# Patient Record
Sex: Female | Born: 1990 | Race: Black or African American | Hispanic: No | Marital: Single | Smoking: Current every day smoker
Health system: Southern US, Community
[De-identification: ages and names within clinical notes are randomized; demographics above are authoritative.]

## PROBLEM LIST (undated history)

## (undated) ENCOUNTER — Ambulatory Visit (HOSPITAL_COMMUNITY): Payer: Medicaid Other

## (undated) ENCOUNTER — Inpatient Hospital Stay (HOSPITAL_COMMUNITY): Payer: Self-pay

## (undated) DIAGNOSIS — R87629 Unspecified abnormal cytological findings in specimens from vagina: Secondary | ICD-10-CM

## (undated) DIAGNOSIS — J40 Bronchitis, not specified as acute or chronic: Secondary | ICD-10-CM

## (undated) DIAGNOSIS — O99310 Alcohol use complicating pregnancy, unspecified trimester: Secondary | ICD-10-CM

## (undated) DIAGNOSIS — A549 Gonococcal infection, unspecified: Secondary | ICD-10-CM

## (undated) HISTORY — PX: OTHER SURGICAL HISTORY: SHX169

## (undated) HISTORY — DX: Alcohol use complicating pregnancy, unspecified trimester: O99.310

## (undated) HISTORY — PX: WISDOM TOOTH EXTRACTION: SHX21

---
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
## 2008-09-06 ENCOUNTER — Emergency Department (HOSPITAL_COMMUNITY): Admission: EM | Admit: 2008-09-06 | Discharge: 2008-09-06 | Payer: Self-pay | Admitting: Emergency Medicine

## 2008-11-08 ENCOUNTER — Emergency Department (HOSPITAL_COMMUNITY): Admission: EM | Admit: 2008-11-08 | Discharge: 2008-11-08 | Payer: Self-pay | Admitting: Emergency Medicine

## 2009-05-06 ENCOUNTER — Emergency Department (HOSPITAL_COMMUNITY): Admission: EM | Admit: 2009-05-06 | Discharge: 2009-05-06 | Payer: Self-pay | Admitting: Emergency Medicine

## 2009-11-08 ENCOUNTER — Emergency Department (HOSPITAL_COMMUNITY): Admission: EM | Admit: 2009-11-08 | Discharge: 2009-11-08 | Payer: Self-pay | Admitting: Emergency Medicine

## 2010-01-30 ENCOUNTER — Emergency Department (HOSPITAL_COMMUNITY): Admission: EM | Admit: 2010-01-30 | Discharge: 2010-01-30 | Payer: Self-pay | Admitting: Emergency Medicine

## 2010-03-07 ENCOUNTER — Observation Stay (HOSPITAL_COMMUNITY)
Admission: EM | Admit: 2010-03-07 | Discharge: 2010-03-07 | Payer: Self-pay | Source: Home / Self Care | Admitting: Emergency Medicine

## 2010-03-07 ENCOUNTER — Emergency Department (HOSPITAL_COMMUNITY)
Admission: EM | Admit: 2010-03-07 | Discharge: 2010-03-07 | Disposition: A | Discharge status: Other Acute Inpatient Hospital | Payer: Self-pay | Source: Home / Self Care | Admitting: Emergency Medicine

## 2010-05-22 LAB — URINE CULTURE

## 2010-05-22 LAB — URINE MICROSCOPIC-ADD ON

## 2010-05-22 LAB — POCT I-STAT, CHEM 8
Chloride: 98 mEq/L (ref 96–112)
HCT: 51 % — ABNORMAL HIGH (ref 36.0–46.0)
Hemoglobin: 17.3 g/dL — ABNORMAL HIGH (ref 12.0–15.0)
Potassium: 3.6 mEq/L (ref 3.5–5.1)
Sodium: 135 mEq/L (ref 135–145)

## 2010-05-22 LAB — DIFFERENTIAL
Basophils Relative: 0 % (ref 0–1)
Eosinophils Absolute: 0 10*3/uL (ref 0.0–0.7)
Monocytes Relative: 11 % (ref 3–12)
Neutrophils Relative %: 85 % — ABNORMAL HIGH (ref 43–77)

## 2010-05-22 LAB — POCT URINALYSIS DIPSTICK
Glucose, UA: 100 mg/dL — AB
Ketones, ur: 15 mg/dL — AB
Specific Gravity, Urine: >=1.03 (ref 1.005–1.030)

## 2010-05-22 LAB — POCT PREGNANCY, URINE
Preg Test, Ur: NEGATIVE
Preg Test, Ur: NEGATIVE

## 2010-05-22 LAB — CBC
HCT: 36.2 % (ref 36.0–46.0)
Hemoglobin: 12.6 g/dL (ref 12.0–15.0)
RBC: 3.92 MIL/uL (ref 3.87–5.11)
WBC: 18.5 10*3/uL — ABNORMAL HIGH (ref 4.0–10.5)

## 2010-05-22 LAB — URINALYSIS, ROUTINE W REFLEX MICROSCOPIC
Glucose, UA: NEGATIVE mg/dL
Ketones, ur: 15 mg/dL — AB
Specific Gravity, Urine: 1.017 (ref 1.005–1.030)
pH: 5.5 (ref 5.0–8.0)

## 2010-05-22 LAB — COMPREHENSIVE METABOLIC PANEL
ALT: 43 U/L — ABNORMAL HIGH (ref 0–35)
Alkaline Phosphatase: 44 U/L (ref 39–117)
CO2: 22 mEq/L (ref 19–32)
GFR calc non Af Amer: 33 mL/min — ABNORMAL LOW (ref >60)
Glucose, Bld: 115 mg/dL — ABNORMAL HIGH (ref 70–99)
Potassium: 3.1 mEq/L — ABNORMAL LOW (ref 3.5–5.1)
Sodium: 130 mEq/L — ABNORMAL LOW (ref 135–145)

## 2010-05-22 LAB — LIPASE, BLOOD: Lipase: 15 U/L (ref 11–59)

## 2010-05-23 LAB — POCT URINALYSIS DIPSTICK
Bilirubin Urine: NEGATIVE
Glucose, UA: NEGATIVE mg/dL
Ketones, ur: NEGATIVE mg/dL
Specific Gravity, Urine: 1.02 (ref 1.005–1.030)

## 2010-05-23 LAB — POCT PREGNANCY, URINE: Preg Test, Ur: NEGATIVE

## 2010-05-31 LAB — URINALYSIS, ROUTINE W REFLEX MICROSCOPIC
Bilirubin Urine: NEGATIVE
Hgb urine dipstick: NEGATIVE
Specific Gravity, Urine: 1.006 (ref 1.005–1.030)
pH: 7.5 (ref 5.0–8.0)

## 2010-05-31 LAB — CBC
HCT: 41 % (ref 36.0–46.0)
Hemoglobin: 13.5 g/dL (ref 12.0–15.0)
MCV: 95.8 fL (ref 78.0–100.0)
RBC: 4.28 MIL/uL (ref 3.87–5.11)
WBC: 6.8 10*3/uL (ref 4.0–10.5)

## 2010-05-31 LAB — COMPREHENSIVE METABOLIC PANEL
AST: 22 U/L (ref 0–37)
BUN: 8 mg/dL (ref 6–23)
CO2: 29 mEq/L (ref 19–32)
Chloride: 107 mEq/L (ref 96–112)
Creatinine, Ser: 0.81 mg/dL (ref 0.4–1.2)
GFR calc non Af Amer: >60 mL/min (ref >60)
Total Bilirubin: 0.3 mg/dL (ref 0.3–1.2)

## 2010-05-31 LAB — DIFFERENTIAL
Basophils Absolute: 0 10*3/uL (ref 0.0–0.1)
Lymphocytes Relative: 33 % (ref 12–46)
Lymphs Abs: 2.2 10*3/uL (ref 0.7–4.0)
Monocytes Relative: 6 % (ref 3–12)

## 2010-06-17 LAB — POCT PREGNANCY, URINE: Preg Test, Ur: NEGATIVE

## 2010-06-30 ENCOUNTER — Inpatient Hospital Stay (INDEPENDENT_AMBULATORY_CARE_PROVIDER_SITE_OTHER)
Admission: RE | Admit: 2010-06-30 | Discharge: 2010-06-30 | Disposition: A | Discharge status: Home / Self Care | Payer: Self-pay | Source: Ambulatory Visit | Attending: Family Medicine | Admitting: Family Medicine

## 2010-06-30 DIAGNOSIS — N760 Acute vaginitis: Secondary | ICD-10-CM

## 2010-06-30 LAB — POCT URINALYSIS DIP (DEVICE)
Glucose, UA: NEGATIVE mg/dL
Nitrite: NEGATIVE
Protein, ur: NEGATIVE mg/dL
Urobilinogen, UA: 0.2 mg/dL (ref 0.0–1.0)
pH: 6.5 (ref 5.0–8.0)

## 2010-06-30 LAB — WET PREP, GENITAL: Trich, Wet Prep: NONE SEEN

## 2010-07-01 LAB — GC/CHLAMYDIA PROBE AMP, GENITAL
Chlamydia, DNA Probe: NEGATIVE
GC Probe Amp, Genital: NEGATIVE

## 2010-09-17 ENCOUNTER — Inpatient Hospital Stay (INDEPENDENT_AMBULATORY_CARE_PROVIDER_SITE_OTHER)
Admission: RE | Admit: 2010-09-17 | Discharge: 2010-09-17 | Disposition: A | Discharge status: Home / Self Care | Payer: Self-pay | Source: Ambulatory Visit | Attending: Emergency Medicine | Admitting: Emergency Medicine

## 2010-09-17 DIAGNOSIS — N949 Unspecified condition associated with female genital organs and menstrual cycle: Secondary | ICD-10-CM

## 2010-09-17 DIAGNOSIS — N898 Other specified noninflammatory disorders of vagina: Secondary | ICD-10-CM

## 2010-09-17 LAB — POCT PREGNANCY, URINE: Preg Test, Ur: NEGATIVE

## 2010-09-17 LAB — WET PREP, GENITAL
Trich, Wet Prep: NONE SEEN
Yeast Wet Prep HPF POC: NONE SEEN

## 2010-09-17 LAB — POCT URINALYSIS DIP (DEVICE)
Bilirubin Urine: NEGATIVE
Glucose, UA: NEGATIVE mg/dL
Ketones, ur: NEGATIVE mg/dL
Specific Gravity, Urine: 1.01 (ref 1.005–1.030)

## 2010-09-22 ENCOUNTER — Inpatient Hospital Stay (HOSPITAL_COMMUNITY): Payer: Self-pay

## 2010-09-22 ENCOUNTER — Encounter (HOSPITAL_COMMUNITY): Payer: Self-pay | Admitting: *Deleted

## 2010-09-22 ENCOUNTER — Inpatient Hospital Stay (HOSPITAL_COMMUNITY)
Admission: AD | Admit: 2010-09-22 | Discharge: 2010-09-22 | Disposition: A | Discharge status: Home / Self Care | Payer: Self-pay | Source: Ambulatory Visit | Attending: Obstetrics and Gynecology | Admitting: Obstetrics and Gynecology

## 2010-09-22 DIAGNOSIS — R109 Unspecified abdominal pain: Secondary | ICD-10-CM

## 2010-09-22 DIAGNOSIS — B9689 Other specified bacterial agents as the cause of diseases classified elsewhere: Secondary | ICD-10-CM | POA: Insufficient documentation

## 2010-09-22 DIAGNOSIS — N760 Acute vaginitis: Secondary | ICD-10-CM | POA: Insufficient documentation

## 2010-09-22 DIAGNOSIS — A499 Bacterial infection, unspecified: Secondary | ICD-10-CM

## 2010-09-22 DIAGNOSIS — N72 Inflammatory disease of cervix uteri: Secondary | ICD-10-CM

## 2010-09-22 LAB — URINALYSIS, ROUTINE W REFLEX MICROSCOPIC
Glucose, UA: NEGATIVE mg/dL
Hgb urine dipstick: NEGATIVE
Ketones, ur: NEGATIVE mg/dL
Protein, ur: NEGATIVE mg/dL
pH: 5.5 (ref 5.0–8.0)

## 2010-09-22 LAB — WET PREP, GENITAL: Trich, Wet Prep: NONE SEEN

## 2010-09-22 LAB — CBC
Hemoglobin: 12.8 g/dL (ref 12.0–15.0)
MCHC: 33.7 g/dL (ref 30.0–36.0)
Platelets: 267 10*3/uL (ref 150–400)
RBC: 3.98 MIL/uL (ref 3.87–5.11)

## 2010-09-22 LAB — HCG, SERUM, QUALITATIVE: Preg, Serum: NEGATIVE

## 2010-09-22 MED ORDER — KETOROLAC TROMETHAMINE 60 MG/2ML IM SOLN
60.0000 mg | Freq: Once | INTRAMUSCULAR | Status: DC
  Filled 2010-09-22: qty 2

## 2010-09-22 MED ORDER — AZITHROMYCIN 1 G PO PACK
1.0000 g | PACK | Freq: Once | ORAL | Status: AC
  Administered 2010-09-22: 1 g via ORAL
  Filled 2010-09-22: qty 1

## 2010-09-22 MED ORDER — CEFIXIME 400 MG PO TABS
400.0000 mg | ORAL_TABLET | Freq: Once | ORAL | Status: AC
  Administered 2010-09-22: 400 mg via ORAL
  Filled 2010-09-22: qty 1

## 2010-09-22 NOTE — Progress Notes (Signed)
[facility] and got rx for flagyl; pt just got rx filled today; had some bleeding on Monday but is not bleeding; last intercourse last month; pt missed this month;

## 2010-09-22 NOTE — Progress Notes (Signed)
Was given lortabs at hosp for pain in rt side ( bleeding ovary). Took pain med on Sun, had started bleeding on Sat, initially was heavy- but it stopped that night.  Now feels funny.
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
## 2010-09-27 ENCOUNTER — Emergency Department (HOSPITAL_COMMUNITY)
Admission: EM | Admit: 2010-09-27 | Discharge: 2010-09-27 | Disposition: A | Discharge status: Home / Self Care | Payer: Self-pay | Attending: Emergency Medicine | Admitting: Emergency Medicine

## 2010-09-27 ENCOUNTER — Emergency Department (HOSPITAL_COMMUNITY): Payer: Self-pay

## 2010-09-27 DIAGNOSIS — W269XXA Contact with unspecified sharp object(s), initial encounter: Secondary | ICD-10-CM | POA: Insufficient documentation

## 2010-09-27 DIAGNOSIS — S8990XA Unspecified injury of unspecified lower leg, initial encounter: Secondary | ICD-10-CM | POA: Insufficient documentation

## 2010-09-27 DIAGNOSIS — S91109A Unspecified open wound of unspecified toe(s) without damage to nail, initial encounter: Secondary | ICD-10-CM | POA: Insufficient documentation

## 2010-09-27 DIAGNOSIS — S99919A Unspecified injury of unspecified ankle, initial encounter: Secondary | ICD-10-CM | POA: Insufficient documentation

## 2010-09-27 DIAGNOSIS — M79609 Pain in unspecified limb: Secondary | ICD-10-CM | POA: Insufficient documentation

## 2010-09-27 DIAGNOSIS — F1010 Alcohol abuse, uncomplicated: Secondary | ICD-10-CM | POA: Insufficient documentation

## 2010-11-30 LAB — CBC
HCT: 35.7 — ABNORMAL LOW
Hemoglobin: 11.9 — ABNORMAL LOW
Platelets: 348
RDW: 15.4
WBC: 6.8

## 2010-11-30 LAB — DIFFERENTIAL
Basophils Absolute: 0
Basophils Relative: 1
Monocytes Absolute: 0.5
Neutro Abs: 3.4
Neutrophils Relative %: 49

## 2010-11-30 LAB — I-STAT 8, (EC8 V) (CONVERTED LAB)
Acid-Base Excess: 1
Glucose, Bld: 80
Hemoglobin: 13.3
Potassium: 3.3 — ABNORMAL LOW
Sodium: 141
TCO2: 24

## 2010-11-30 LAB — URINE MICROSCOPIC-ADD ON

## 2010-11-30 LAB — URINALYSIS, ROUTINE W REFLEX MICROSCOPIC
Protein, ur: NEGATIVE
Urobilinogen, UA: 0.2

## 2010-11-30 LAB — HCG, QUANTITATIVE, PREGNANCY: hCG, Beta Chain, Quant, S: <2

## 2010-11-30 LAB — POCT I-STAT CREATININE
Creatinine, Ser: 0.9
Operator id: 270651

## 2011-01-11 NOTE — ED Provider Notes (Signed)
Agree with above note.  Terri Herrera 01/11/2011 12:34 PM

## 2011-02-19 ENCOUNTER — Inpatient Hospital Stay (HOSPITAL_COMMUNITY)
Admission: AD | Admit: 2011-02-19 | Discharge: 2011-02-19 | Disposition: A | Discharge status: Home / Self Care | Payer: Self-pay | Source: Ambulatory Visit | Attending: Family Medicine | Admitting: Family Medicine

## 2011-02-19 ENCOUNTER — Encounter (HOSPITAL_COMMUNITY): Payer: Self-pay | Admitting: *Deleted

## 2011-02-19 ENCOUNTER — Inpatient Hospital Stay (HOSPITAL_COMMUNITY): Payer: Self-pay

## 2011-02-19 DIAGNOSIS — R141 Gas pain: Secondary | ICD-10-CM

## 2011-02-19 DIAGNOSIS — R109 Unspecified abdominal pain: Secondary | ICD-10-CM | POA: Insufficient documentation

## 2011-02-19 DIAGNOSIS — R143 Flatulence: Secondary | ICD-10-CM

## 2011-02-19 LAB — CBC
HCT: 35.8 % — ABNORMAL LOW (ref 36.0–46.0)
Hemoglobin: 12.2 g/dL (ref 12.0–15.0)
WBC: 5.6 10*3/uL (ref 4.0–10.5)

## 2011-02-19 LAB — HCG, QUANTITATIVE, PREGNANCY: hCG, Beta Chain, Quant, S: <1 m[IU]/mL (ref <5)

## 2011-02-19 MED ORDER — POLYETHYLENE GLYCOL 3350 17 G PO PACK: 17.0000 g | PACK | Freq: Every day | ORAL | Status: AC

## 2011-02-19 NOTE — Progress Notes (Signed)
Pt has already voided per K. Janann August, RN after returning from [place].  Did not notify RN for collection.
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
## 2011-02-19 NOTE — ED Provider Notes (Signed)
Chart reviewed and agree with management and plan.  

## 2011-02-19 NOTE — Progress Notes (Signed)
Artelia Laroche, CNM at bedside.  poc discussed with pt.
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
## 2011-04-25 ENCOUNTER — Encounter (HOSPITAL_COMMUNITY): Payer: Self-pay | Admitting: Emergency Medicine

## 2011-04-25 ENCOUNTER — Emergency Department (HOSPITAL_COMMUNITY)
Admission: EM | Admit: 2011-04-25 | Discharge: 2011-04-25 | Disposition: A | Discharge status: Home / Self Care | Payer: Medicaid Other | Attending: Emergency Medicine | Admitting: Emergency Medicine

## 2011-04-25 ENCOUNTER — Emergency Department (HOSPITAL_COMMUNITY): Payer: Medicaid Other

## 2011-04-25 DIAGNOSIS — R509 Fever, unspecified: Secondary | ICD-10-CM | POA: Insufficient documentation

## 2011-04-25 DIAGNOSIS — F17200 Nicotine dependence, unspecified, uncomplicated: Secondary | ICD-10-CM | POA: Insufficient documentation

## 2011-04-25 DIAGNOSIS — J3489 Other specified disorders of nose and nasal sinuses: Secondary | ICD-10-CM | POA: Insufficient documentation

## 2011-04-25 DIAGNOSIS — J029 Acute pharyngitis, unspecified: Secondary | ICD-10-CM | POA: Insufficient documentation

## 2011-04-25 DIAGNOSIS — J020 Streptococcal pharyngitis: Secondary | ICD-10-CM

## 2011-04-25 DIAGNOSIS — R221 Localized swelling, mass and lump, neck: Secondary | ICD-10-CM | POA: Insufficient documentation

## 2011-04-25 DIAGNOSIS — R05 Cough: Secondary | ICD-10-CM | POA: Insufficient documentation

## 2011-04-25 DIAGNOSIS — R220 Localized swelling, mass and lump, head: Secondary | ICD-10-CM | POA: Insufficient documentation

## 2011-04-25 DIAGNOSIS — R059 Cough, unspecified: Secondary | ICD-10-CM | POA: Insufficient documentation

## 2011-04-25 DIAGNOSIS — R110 Nausea: Secondary | ICD-10-CM | POA: Insufficient documentation

## 2011-04-25 DIAGNOSIS — R5381 Other malaise: Secondary | ICD-10-CM | POA: Insufficient documentation

## 2011-04-25 LAB — CBC
Hemoglobin: 14.4 g/dL (ref 12.0–15.0)
MCH: 33.5 pg (ref 26.0–34.0)
MCV: 93.5 fL (ref 78.0–100.0)
RBC: 4.3 MIL/uL (ref 3.87–5.11)

## 2011-04-25 LAB — BASIC METABOLIC PANEL
CO2: 24 mEq/L (ref 19–32)
GFR calc non Af Amer: 83 mL/min — ABNORMAL LOW (ref >90)
Glucose, Bld: 88 mg/dL (ref 70–99)
Potassium: 3.6 mEq/L (ref 3.5–5.1)
Sodium: 132 mEq/L — ABNORMAL LOW (ref 135–145)

## 2011-04-25 LAB — MONONUCLEOSIS SCREEN: Mono Screen: NEGATIVE

## 2011-04-25 MED ORDER — HYDROMORPHONE HCL PF 1 MG/ML IJ SOLN
1.0000 mg | Freq: Once | INTRAMUSCULAR | Status: AC
  Administered 2011-04-25: 1 mg via INTRAVENOUS
  Filled 2011-04-25: qty 1

## 2011-04-25 MED ORDER — HYDROCODONE-ACETAMINOPHEN 7.5-500 MG/15ML PO SOLN: 10.0000 mL | ORAL | Status: AC | PRN

## 2011-04-25 MED ORDER — PENICILLIN G BENZATHINE & PROC 1200000 UNIT/2ML IM SUSP
1.2000 10*6.[IU] | Freq: Once | INTRAMUSCULAR | Status: AC
  Administered 2011-04-25: 1.2 10*6.[IU] via INTRAMUSCULAR
  Filled 2011-04-25: qty 2

## 2011-04-25 MED ORDER — ONDANSETRON HCL 4 MG/2ML IJ SOLN
4.0000 mg | Freq: Once | INTRAMUSCULAR | Status: AC
  Administered 2011-04-25: 4 mg via INTRAVENOUS
  Filled 2011-04-25: qty 2

## 2011-04-25 MED ORDER — SODIUM CHLORIDE 0.9 % IV SOLN: INTRAVENOUS | Status: DC

## 2011-04-25 MED ORDER — IBUPROFEN 800 MG PO TABS: 800.0000 mg | ORAL_TABLET | Freq: Three times a day (TID) | ORAL | Status: AC | PRN

## 2011-04-25 MED ORDER — SODIUM CHLORIDE 0.9 % IV BOLUS (SEPSIS)
1000.0000 mL | Freq: Once | INTRAVENOUS | Status: AC
  Administered 2011-04-25: 1000 mL via INTRAVENOUS

## 2011-04-25 MED ORDER — IOHEXOL 300 MG/ML  SOLN
75.0000 mL | Freq: Once | INTRAMUSCULAR | Status: AC | PRN
  Administered 2011-04-25: 75 mL via INTRAVENOUS

## 2011-04-25 NOTE — ED Provider Notes (Signed)
History     CSN: [id number]  Arrival date & time 04/25/11  1120   First MD Initiated Contact with Patient 04/25/11 1130      Chief Complaint  Patient presents with  . Sore Throat    (Consider location/radiation/quality/duration/timing/severity/associated sxs/prior treatment) Patient is a 21 y.o. female presenting with pharyngitis.  Sore Throat This is a new problem. The current episode started yesterday. The problem occurs constantly. The problem has been rapidly worsening. Pertinent negatives include no chest pain, no abdominal pain, no headaches and no shortness of breath. The symptoms are aggravated by swallowing. The symptoms are relieved by nothing. She has tried nothing for the symptoms.   Associated with nasal congestion some cough. No rash. Some nausea no vomiting. Feels feverish.  Past Medical History  Diagnosis Date  . No pertinent past medical history     History reviewed. No pertinent past surgical history.  Family History  Problem Relation Age of Onset  . Asthma Mother   . Diabetes Mother   . Alcohol abuse Mother   . Cancer Father   . Cancer Paternal Uncle   . Cancer Paternal Grandmother     History  Substance Use Topics  . Smoking status: Current Everyday Smoker -- 0.2 packs/day  . Smokeless tobacco: Not on file  . Alcohol Use: 1.2 oz/week    2 Cans of beer per week    OB History    Grav Para Term Preterm Abortions TAB SAB Ect Mult Living   2    2  2    0      Review of Systems  Constitutional: Positive for fever and fatigue.  HENT: Positive for congestion and sore throat. Negative for neck pain and neck stiffness.   Eyes: Negative for redness.  Respiratory: Positive for cough. Negative for shortness of breath.   Cardiovascular: Negative for chest pain.  Gastrointestinal: Negative for nausea, abdominal pain and diarrhea.  Genitourinary: Negative for dysuria.  Musculoskeletal: Negative for back pain.  Skin: Negative for rash.  Neurological:  Negative for headaches.  Hematological: Negative for adenopathy.    Allergies  Review of patient's allergies indicates no known allergies.  Home Medications  No current outpatient prescriptions on file.  BP 119/80  Pulse 146  Temp(Src) 100.1 F (37.8 C) (Oral)  Resp 18  SpO2 99%  Physical Exam  Nursing note and vitals reviewed. Constitutional: She is oriented to person, place, and time. She appears well-developed and well-nourished.  HENT:  Head: Normocephalic and atraumatic.  Mouth/Throat: Oropharynx is clear and moist.       A. marked soft palate swelling left greater than right no tonsillar exudate. No cervical adenopathy  Eyes: Conjunctivae and EOM are normal. Pupils are equal, round, and reactive to light.  Neck: Normal range of motion. Neck supple.  Cardiovascular: Normal rate, regular rhythm and normal heart sounds.   No murmur heard. Pulmonary/Chest: Effort normal and breath sounds normal.  Abdominal: Soft. Bowel sounds are normal. There is no tenderness.  Musculoskeletal: Normal range of motion. She exhibits no edema.  Lymphadenopathy:    She has no cervical adenopathy.  Neurological: She is alert and oriented to person, place, and time. No cranial nerve deficit. She exhibits normal muscle tone. Coordination normal.  Skin: Skin is warm. No rash noted.    ED Course  Procedures (including critical care time)   Labs Reviewed  RAPID STREP SCREEN  BASIC METABOLIC PANEL  CBC   No results found.   1. Pharyngitis  MDM   Move patient to CDU pending CT with contrast of the neck to rule out peritonsillar abscess. Rapid strep test is pending labs are pending. Will hydrate with IV normal saline and provide pain medicine basic labs have been ordered.   Scan results and lab results will be interpreted by CDU mid-level.  Shelda Jakes, MD   Medical screening examination/treatment/procedure(s) were conducted as a shared visit with non-physician  practitioner(s) and myself.  I personally evaluated the patient during the encounter        Shelda Jakes, MD 04/25/11 1159

## 2011-04-25 NOTE — ED Notes (Signed)
Sore throat cold s/s x 2 days

## 2011-04-25 NOTE — Discharge Instructions (Signed)
The of fluids over the next few days.  Return to the emergency department immediately if he have any difficulty swallowing or breathing or unable to tolerate fluids by mouth.  You may return to the ER at any time for worsening condition or any new symptoms that concern you.  Discussed, you were found to have an irregular spot on your thyroid.  You'll need to have this followed up with an ultrasound as an outpatient.  Please discuss this with your primary care provider.  He may also use the list is as below to find a primary care provider to followup with if the health Department is unable to do this for you.   Strep Throat Strep throat is an infection of the throat caused by a bacteria named Streptococcus pyogenes. Your caregiver may call the infection streptococcal "tonsillitis" or "pharyngitis" depending on whether there are signs of inflammation in the tonsils or back of the throat. Strep throat is most common in children from 59 to 53 years old during the cold months of the year, but it can occur in people of any age during any season. This infection is spread from person to person (contagious) through coughing, sneezing, or other close contact. SYMPTOMS   Fever or chills.   Painful, swollen, red tonsils or throat.   Pain or difficulty when swallowing.   White or yellow spots on the tonsils or throat.   Swollen, tender lymph nodes or "glands" of the neck or under the jaw.   Red rash all over the body (rare).  DIAGNOSIS  Many different infections can cause the same symptoms. A test must be done to confirm the diagnosis so the right treatment can be given. A "rapid strep test" can help your caregiver make the diagnosis in a few minutes. If this test is not available, a light swab of the infected area can be used for a throat culture test. If a throat culture test is done, results are usually available in a day or two. TREATMENT  Strep throat is treated with antibiotic medicine. HOME CARE  INSTRUCTIONS   Gargle with 1 tsp of salt in 1 cup of warm water, 3 to 4 times per day or as needed for comfort.   Family members who also have a sore throat or fever should be tested for strep throat and treated with antibiotics if they have the strep infection.   Make sure everyone in your household washes their hands well.   Do not share food, drinking cups, or personal items that could cause the infection to spread to others.   You may need to eat a soft food diet until your sore throat gets better.   Drink enough water and fluids to keep your urine clear or pale yellow. This will help prevent dehydration.   Get plenty of rest.   Stay home from school, daycare, or work until you have been on antibiotics for 24 hours.   Only take over-the-counter or prescription medicines for pain, discomfort, or fever as directed by your caregiver.   If antibiotics are prescribed, take them as directed. Finish them even if you start to feel better.  SEEK MEDICAL CARE IF:   The glands in your neck continue to enlarge.   You develop a rash, cough, or earache.   You cough up green, yellow-brown, or bloody sputum.   You have pain or discomfort not controlled by medicines.   Your problems seem to be getting worse rather than better.  SEEK IMMEDIATE  MEDICAL CARE IF:   You develop any new symptoms such as vomiting, severe headache, stiff or painful neck, chest pain, shortness of breath, or trouble swallowing.   You develop severe throat pain, drooling, or changes in your voice.   You develop swelling of the neck, or the skin on the neck becomes red and tender.   You have a fever.   You develop signs of dehydration, such as fatigue, dry mouth, and decreased urination.   You become increasingly sleepy, or you cannot wake up completely.  Document Released: 02/24/2000 Document Revised: 11/08/2010 Document Reviewed: 04/27/2010 [facility] Patient Information 2012 [place], [place].Salt Water  Gargle This solution will help make your mouth and throat feel better. HOME CARE INSTRUCTIONS   Mix 1 teaspoon of salt in 8 ounces of warm water.   Gargle with this solution as much or often as you need or as directed. Swish and gargle gently if you have any sores or wounds in your mouth.   Do not swallow this mixture.  Document Released: 12/01/2003 Document Revised: 11/08/2010 Document Reviewed: 04/23/2008 [facility] Patient Information 2012 [place], [place].  RESOURCE GUIDE  Dental Problems  Patients with Medicaid: [facility] [phone] [address]                                           [phone] [address] Phone:  [phone]                                                  Phone:  [phone]  If unable to pay or uninsured, contact:  [facility] or [facility]. to become qualified for the adult dental clinic.  Chronic Pain Problems Contact [facility]  [phone] Patients need to be referred by their primary care doctor.  Insufficient Money for Medicine Contact [employer]:  call "211" or [facility] 709-165-0518.  No Primary Care Doctor Call [facility]  [phone] Other agencies that provide inexpensive medical care    [facility]  [phone]    [facility]  [phone]    [facility]  [phone]    [facility]  [phone]    [facility]  [phone]    [facility]  [phone]  Psychological Services [facility]  [phone] [facility]  [phone] [facility]   [phone] (emergency services [phone])  Substance Abuse Resources [facility]  936-412-6727 [facility] ([phone] [facility] [place] ([phone] Floydene Flock [phone] Residential & Outpatient Substance Abuse Program  [phone]  Abuse/Neglect [facility] Child Abuse Hotline 518 038 1485 [facility] Child Abuse Hotline [phone] (After Hours)  Emergency Shelter [facility] [phone]) [phone]  Maternity Homes Room at the [place] 318-037-7708 [facility] [phone]  MRSA Hotline #:   641-559-8453    [facility]  [facility] Dept. [address]      [place] Phone:  [phone]                                   Phone:  [phone]                 Phone:  [place] Phone:  [place] [phone] (After Hours)

## 2011-04-25 NOTE — ED Provider Notes (Signed)
1:57 PM Patient is is CDU holding for labs, CT neck to r/o peritonsillar abscess.  Patient reports continued pain.  Is tolerating oral secretions and breathing without distress.  Patient is strep positive, CT scan shows thyroid abnormality, no peritonsillar abscess.    Continues to tolerate her oral secretions while in the CDU.  Pain is treated in the CDU.  Antibiotics for strep. pharyngitis have been administered in the ED. I have discussed all results with the patient and patient verbalizes understanding.  Patient is discharged home with pain medication and instructions for follow up with primary care provider for thyroid ultrasound as an outpatient.  Dillard Cannon [place], [place] 04/25/11 [phone]

## 2011-04-25 NOTE — ED Notes (Signed)
Trixie Dredge PA at bedside with pt.

## 2011-09-01 ENCOUNTER — Inpatient Hospital Stay (HOSPITAL_COMMUNITY)
Admission: AD | Admit: 2011-09-01 | Discharge: 2011-09-01 | Disposition: A | Discharge status: Home / Self Care | Payer: Self-pay | Source: Ambulatory Visit | Attending: Family Medicine | Admitting: Family Medicine

## 2011-09-01 ENCOUNTER — Encounter (HOSPITAL_COMMUNITY): Payer: Self-pay | Admitting: Obstetrics and Gynecology

## 2011-09-01 DIAGNOSIS — R220 Localized swelling, mass and lump, head: Secondary | ICD-10-CM

## 2011-09-01 DIAGNOSIS — N938 Other specified abnormal uterine and vaginal bleeding: Secondary | ICD-10-CM | POA: Insufficient documentation

## 2011-09-01 DIAGNOSIS — Z3202 Encounter for pregnancy test, result negative: Secondary | ICD-10-CM | POA: Insufficient documentation

## 2011-09-01 DIAGNOSIS — N949 Unspecified condition associated with female genital organs and menstrual cycle: Secondary | ICD-10-CM | POA: Insufficient documentation

## 2011-09-01 LAB — URINALYSIS, ROUTINE W REFLEX MICROSCOPIC
Bilirubin Urine: NEGATIVE
Glucose, UA: NEGATIVE mg/dL
Ketones, ur: NEGATIVE mg/dL
Leukocytes, UA: NEGATIVE
Protein, ur: NEGATIVE mg/dL
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
## 2011-09-02 NOTE — MAU Provider Note (Signed)
Chart reviewed and agree with management and plan.  

## 2012-06-17 ENCOUNTER — Encounter (HOSPITAL_COMMUNITY): Payer: Self-pay | Admitting: Emergency Medicine

## 2012-06-17 ENCOUNTER — Emergency Department (INDEPENDENT_AMBULATORY_CARE_PROVIDER_SITE_OTHER)
Admission: EM | Admit: 2012-06-17 | Discharge: 2012-06-17 | Disposition: A | Discharge status: Nursing Home (Includes ICF) | Payer: Self-pay | Source: Home / Self Care | Attending: Emergency Medicine | Admitting: Emergency Medicine

## 2012-06-17 ENCOUNTER — Emergency Department (HOSPITAL_COMMUNITY)
Admission: EM | Admit: 2012-06-17 | Discharge: 2012-06-17 | Disposition: A | Discharge status: Home / Self Care | Payer: Self-pay | Attending: Emergency Medicine | Admitting: Emergency Medicine

## 2012-06-17 DIAGNOSIS — M549 Dorsalgia, unspecified: Secondary | ICD-10-CM

## 2012-06-17 DIAGNOSIS — R102 Pelvic and perineal pain: Secondary | ICD-10-CM

## 2012-06-17 DIAGNOSIS — R112 Nausea with vomiting, unspecified: Secondary | ICD-10-CM | POA: Insufficient documentation

## 2012-06-17 DIAGNOSIS — F17200 Nicotine dependence, unspecified, uncomplicated: Secondary | ICD-10-CM | POA: Insufficient documentation

## 2012-06-17 DIAGNOSIS — N949 Unspecified condition associated with female genital organs and menstrual cycle: Secondary | ICD-10-CM

## 2012-06-17 DIAGNOSIS — R109 Unspecified abdominal pain: Secondary | ICD-10-CM | POA: Insufficient documentation

## 2012-06-17 DIAGNOSIS — R071 Chest pain on breathing: Secondary | ICD-10-CM

## 2012-06-17 DIAGNOSIS — R0789 Other chest pain: Secondary | ICD-10-CM

## 2012-06-17 LAB — CBC WITH DIFFERENTIAL/PLATELET
Basophils Absolute: 0 10*3/uL (ref 0.0–0.1)
Basophils Relative: 0 % (ref 0–1)
Lymphocytes Relative: 21 % (ref 12–46)
MCHC: 35.2 g/dL (ref 30.0–36.0)
Neutro Abs: 3.5 10*3/uL (ref 1.7–7.7)
Platelets: 218 10*3/uL (ref 150–400)
RDW: 13.1 % (ref 11.5–15.5)
WBC: 5.1 10*3/uL (ref 4.0–10.5)

## 2012-06-17 LAB — COMPREHENSIVE METABOLIC PANEL
ALT: 61 U/L — ABNORMAL HIGH (ref 0–35)
Alkaline Phosphatase: 25 U/L — ABNORMAL LOW (ref 39–117)
BUN: 9 mg/dL (ref 6–23)
CO2: 24 mEq/L (ref 19–32)
Chloride: 100 mEq/L (ref 96–112)
GFR calc Af Amer: >90 mL/min (ref >90)
GFR calc non Af Amer: >90 mL/min (ref >90)
Glucose, Bld: 93 mg/dL (ref 70–99)
Potassium: 4.2 mEq/L (ref 3.5–5.1)
Total Bilirubin: 0.4 mg/dL (ref 0.3–1.2)
Total Protein: 8.5 g/dL — ABNORMAL HIGH (ref 6.0–8.3)

## 2012-06-17 LAB — POCT URINALYSIS DIP (DEVICE)
Bilirubin Urine: NEGATIVE
Nitrite: NEGATIVE
Specific Gravity, Urine: <=1.005 (ref 1.005–1.030)
pH: 6 (ref 5.0–8.0)

## 2012-06-17 MED ORDER — HYDROCODONE-ACETAMINOPHEN 5-325 MG PO TABS: 1.0000 | ORAL_TABLET | ORAL | Status: DC | PRN

## 2012-06-17 MED ORDER — OXYCODONE-ACETAMINOPHEN 5-325 MG PO TABS
1.0000 | ORAL_TABLET | Freq: Once | ORAL | Status: AC
  Administered 2012-06-17: 1 via ORAL
  Filled 2012-06-17: qty 1

## 2012-06-17 NOTE — ED Notes (Signed)
Spoke to [employer] [facility] [employer]. Informed of pt being transferred via shuttle.

## 2012-06-17 NOTE — ED Provider Notes (Signed)
History     CSN: [id number]  Arrival date & time 06/17/12  1126   First MD Initiated Contact with Patient 06/17/12 1302      Chief Complaint  Patient presents with  . Abdominal Pain  . Breast Pain    (Consider location/radiation/quality/duration/timing/severity/associated sxs/prior treatment) HPI Comments: Patient presents urgent care complaining of severe abdominal pain, ( points to lower abdomen bilaterally), pain radiates to her back and is also having bilateral breast pain. She describes that she's been sick for about a week denies any vaginal discharge or urinary symptoms such as increased frequency pressure burning with urination. Denies any recent traumas or falls. Have been also feeling nauseous and having chills vomited a couple times. For the last 2 days. She has been testing herself at home to make sure she is not pregnant she had a negative test. Pain is worse when she moves around he leans forward and at this point has not found a position diseases of the pain.  Patient is a 22 y.o. female presenting with abdominal pain. The history is provided by the patient.  Abdominal Pain Pain quality: pressure and sharp   Pain radiates to:  Back Pain severity:  Moderate Onset quality:  Sudden Relieved by:  Nothing Worsened by:  Movement and palpation Associated symptoms: anorexia, chills, nausea and vomiting   Associated symptoms: no chest pain, no constipation, no cough, no diarrhea, no dysuria, no hematuria, no shortness of breath, no vaginal bleeding and no vaginal discharge   Risk factors: no alcohol abuse     Past Medical History  Diagnosis Date  . No pertinent past medical history     History reviewed. No pertinent past surgical history.  Family History  Problem Relation Age of Onset  . Asthma Mother   . Diabetes Mother   . Alcohol abuse Mother   . Cancer Father   . Cancer Paternal Uncle   . Cancer Paternal Grandmother     History  Substance Use Topics  .  Smoking status: Current Every Day Smoker -- 0.25 packs/day  . Smokeless tobacco: Not on file  . Alcohol Use: 1.2 oz/week    2 Cans of beer per week    OB History   Grav Para Term Preterm Abortions TAB SAB Ect Mult Living   3    2  2    0      Review of Systems  Constitutional: Positive for chills, activity change and appetite change. Negative for unexpected weight change.  HENT: Negative for neck pain.   Respiratory: Positive for chest tightness. Negative for cough and shortness of breath.   Cardiovascular: Negative for chest pain, palpitations and leg swelling.  Gastrointestinal: Positive for nausea, vomiting, abdominal pain and anorexia. Negative for diarrhea, constipation and abdominal distention.  Genitourinary: Positive for menstrual problem. Negative for dysuria, frequency, hematuria, flank pain, vaginal bleeding, vaginal discharge and genital sores.  Musculoskeletal: Negative for myalgias.  Skin: Negative for rash.  Neurological: Negative for dizziness.    Allergies  Review of patient's allergies indicates no known allergies.  Home Medications  No current outpatient prescriptions on file.  BP 113/76  Pulse 97  Temp(Src) 98.7 F (37.1 C) (Oral)  Resp 16  SpO2 97%  LMP 05/10/2012  Breastfeeding? Unknown  Physical Exam  Nursing note and vitals reviewed. Constitutional: Vital signs are normal. She appears well-developed. She appears distressed.  HENT:  Head: Normocephalic.  Eyes: No scleral icterus.  Neck: No JVD present.  Pulmonary/Chest: Effort normal and breath sounds  normal.    Abdominal: Soft. She exhibits no distension and no mass. There is no hepatosplenomegaly, splenomegaly or hepatomegaly. There is tenderness in the suprapubic area. There is guarding. There is no rebound, no CVA tenderness, no tenderness at McBurney's point and negative Murphy's sign. No hernia. Hernia confirmed negative in the ventral area.    Genitourinary: There is breast tenderness.  No breast swelling, discharge or bleeding.  Musculoskeletal: She exhibits no tenderness.       Lumbar back: She exhibits tenderness. She exhibits normal range of motion and no bony tenderness.       Back:  Neurological: She is alert.  Skin: No rash noted. No erythema.    ED Course  Procedures (including critical care time)  Labs Reviewed  POCT URINALYSIS DIP (DEVICE) - Abnormal; Notable for the following:    Hgb urine dipstick TRACE (*)    Leukocytes, UA SMALL (*)    All other components within normal limits  POCT PREGNANCY, URINE   No results found.   1. Pelvic pain in female   2. Back pain   3. Acute chest wall pain       MDM  Patient with multiple complaints, most significant for moderate to severe pelvic pain with radiation to her back associated with (mastalgia) patient looks uncomfortable significant reproducible lower abdominal pain. At this point, differential diagnosis is wide but most concerning at this point is her significant pelvic pain and might require a comprehensive evaluation including imaging we'll transfer patient to the emergency department.      Jimmie Molly, MD 06/17/12 1344

## 2012-06-17 NOTE — ED Notes (Signed)
Pt c/o abdominal pain and breast pain x 1 week. Pt states no problems urinating or with BM. One reported episode of emesis two days ago. Cannot lie on her stomach without pain. Menstrual period is late by one week. Patient is alert and oriented.

## 2012-06-17 NOTE — ED Notes (Signed)
Pt sent from [facility] for eval for lower abd pain and breast pain; pt denies vaginal discharge

## 2012-06-18 NOTE — ED Provider Notes (Signed)
History     CSN: [id number]  Arrival date & time 06/17/12  1406   First MD Initiated Contact with Patient 06/17/12 1503      Chief Complaint  Patient presents with  . Abdominal Pain     The history is provided by the patient.   patient reports lower abdominal pain with some nausea vomiting over the past 24-48 hours.  Her abdominal pain is improving at this time.  She was seen at the urgent care and sent to an emergency room for further evaluation.  She denies urinary symptoms.  No vaginal bleeding or vaginal complaints.  Her urine pregnancy test was negative at the urgent care.  Her pain seems to be sharp and stabbing and intermittent.  At times it lasts for 10-20 minutes and then resolves.  No constipation or diarrhea.  No melena or hematochezia.  No flank pain.  Past Medical History  Diagnosis Date  . No pertinent past medical history     History reviewed. No pertinent past surgical history.  Family History  Problem Relation Age of Onset  . Asthma Mother   . Diabetes Mother   . Alcohol abuse Mother   . Cancer Father   . Cancer Paternal Uncle   . Cancer Paternal Grandmother     History  Substance Use Topics  . Smoking status: Current Every Day Smoker -- 0.25 packs/day  . Smokeless tobacco: Not on file  . Alcohol Use: 1.2 oz/week    2 Cans of beer per week    OB History   Grav Para Term Preterm Abortions TAB SAB Ect Mult Living   3    2  2    0      Review of Systems  Gastrointestinal: Positive for abdominal pain.  All other systems reviewed and are negative.    Allergies  Review of patient's allergies indicates no known allergies.  Home Medications   Current Outpatient Rx  Name  Route  Sig  Dispense  Refill  . HYDROcodone-acetaminophen (NORCO/VICODIN) 5-325 MG per tablet   Oral   Take 1 tablet by mouth every 4 (four) hours as needed for pain.   15 tablet   0     BP 118/73  Pulse 89  Temp(Src) 98.6 F (37 C) (Oral)  Resp 18  SpO2 100%  LMP  05/10/2012  Physical Exam  Nursing note and vitals reviewed. Constitutional: She is oriented to person, place, and time. She appears well-developed and well-nourished. No distress.  HENT:  Head: Normocephalic and atraumatic.  Eyes: EOM are normal.  Neck: Normal range of motion.  Cardiovascular: Normal rate, regular rhythm and normal heart sounds.   Pulmonary/Chest: Effort normal and breath sounds normal.  Abdominal: Soft. She exhibits no distension. There is no tenderness. There is no rebound and no guarding.  Musculoskeletal: Normal range of motion.  Neurological: She is alert and oriented to person, place, and time.  Skin: Skin is warm and dry.  Psychiatric: She has a normal mood and affect. Judgment normal.    ED Course  Procedures (including critical care time)  Labs Reviewed  COMPREHENSIVE METABOLIC PANEL - Abnormal; Notable for the following:    Total Protein 8.5 (*)    AST 66 (*)    ALT 61 (*)    Alkaline Phosphatase 25 (*)    All other components within normal limits  CBC WITH DIFFERENTIAL   No results found.   1. Abdominal pain       MDM  Patient's abdominal  exam is benign.  No indication for the advanced imaging.  She will return to the ER in 24 hours for repeat abdominal exam.  My suspicion for appendicitis is very low.  I do not believe this to be ovarian torsion or ovarian cystic pain.  Her pain seems to be more intermittent in the lower abdomen.        Lyanne Co, MD 06/18/12 [phone]
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
## 2013-01-13 ENCOUNTER — Emergency Department (HOSPITAL_COMMUNITY)
Admission: EM | Admit: 2013-01-13 | Discharge: 2013-01-13 | Discharge status: Against Medical Advice | Payer: Self-pay | Attending: Emergency Medicine | Admitting: Emergency Medicine

## 2013-01-13 ENCOUNTER — Encounter (HOSPITAL_COMMUNITY): Payer: Self-pay | Admitting: Emergency Medicine

## 2013-01-13 DIAGNOSIS — Y929 Unspecified place or not applicable: Secondary | ICD-10-CM | POA: Insufficient documentation

## 2013-01-13 DIAGNOSIS — Y939 Activity, unspecified: Secondary | ICD-10-CM | POA: Insufficient documentation

## 2013-01-13 DIAGNOSIS — F17200 Nicotine dependence, unspecified, uncomplicated: Secondary | ICD-10-CM | POA: Insufficient documentation

## 2013-01-13 DIAGNOSIS — IMO0002 Reserved for concepts with insufficient information to code with codable children: Secondary | ICD-10-CM | POA: Insufficient documentation

## 2013-01-13 DIAGNOSIS — S0990XA Unspecified injury of head, initial encounter: Secondary | ICD-10-CM | POA: Insufficient documentation
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
## 2013-01-13 NOTE — ED Notes (Signed)
Pt did not respond to call from waiting room x 1  

## 2013-01-13 NOTE — ED Notes (Signed)
Pt did not respond to call to be seen from waiting room x 2

## 2013-05-29 ENCOUNTER — Emergency Department (HOSPITAL_COMMUNITY)
Admission: EM | Admit: 2013-05-29 | Discharge: 2013-05-29 | Disposition: A | Discharge status: Home / Self Care | Payer: Self-pay | Attending: Emergency Medicine | Admitting: Emergency Medicine

## 2013-05-29 ENCOUNTER — Encounter (HOSPITAL_COMMUNITY): Payer: Self-pay | Admitting: Emergency Medicine

## 2013-05-29 ENCOUNTER — Emergency Department (HOSPITAL_COMMUNITY): Payer: Self-pay

## 2013-05-29 DIAGNOSIS — F17200 Nicotine dependence, unspecified, uncomplicated: Secondary | ICD-10-CM | POA: Insufficient documentation

## 2013-05-29 DIAGNOSIS — Y9389 Activity, other specified: Secondary | ICD-10-CM | POA: Insufficient documentation

## 2013-05-29 DIAGNOSIS — S134XXA Sprain of ligaments of cervical spine, initial encounter: Secondary | ICD-10-CM

## 2013-05-29 DIAGNOSIS — Y92410 Unspecified street and highway as the place of occurrence of the external cause: Secondary | ICD-10-CM | POA: Insufficient documentation

## 2013-05-29 DIAGNOSIS — IMO0002 Reserved for concepts with insufficient information to code with codable children: Secondary | ICD-10-CM | POA: Insufficient documentation

## 2013-05-29 DIAGNOSIS — S0990XA Unspecified injury of head, initial encounter: Secondary | ICD-10-CM | POA: Insufficient documentation

## 2013-05-29 DIAGNOSIS — S139XXA Sprain of joints and ligaments of unspecified parts of neck, initial encounter: Secondary | ICD-10-CM | POA: Insufficient documentation

## 2013-05-29 DIAGNOSIS — K089 Disorder of teeth and supporting structures, unspecified: Secondary | ICD-10-CM | POA: Insufficient documentation

## 2013-05-29 MED ORDER — AMOXICILLIN 500 MG PO CAPS: 500.0000 mg | ORAL_CAPSULE | Freq: Three times a day (TID) | ORAL | Status: DC

## 2013-05-29 MED ORDER — KETOROLAC TROMETHAMINE 60 MG/2ML IM SOLN
60.0000 mg | Freq: Once | INTRAMUSCULAR | Status: AC
Start: 2013-05-29 — End: 2013-05-29
  Administered 2013-05-29: 60 mg via INTRAMUSCULAR
  Filled 2013-05-29: qty 2

## 2013-05-29 MED ORDER — HYDROCODONE-ACETAMINOPHEN 5-325 MG PO TABS: 1.0000 | ORAL_TABLET | ORAL | Status: DC | PRN

## 2013-05-29 MED ORDER — CYCLOBENZAPRINE HCL 10 MG PO TABS: 10.0000 mg | ORAL_TABLET | Freq: Two times a day (BID) | ORAL | Status: DC | PRN
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
## 2013-05-29 NOTE — ED Provider Notes (Signed)
Medical screening examination/treatment/procedure(s) were performed by non-physician practitioner and as supervising physician I was immediately available for consultation/collaboration.   EKG Interpretation None       Arby Dahir, MD 05/29/13 1825 
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
## 2013-10-29 ENCOUNTER — Inpatient Hospital Stay (HOSPITAL_COMMUNITY)
Admission: AD | Admit: 2013-10-29 | Discharge: 2013-10-29 | Disposition: A | Discharge status: Home / Self Care | Payer: Self-pay | Source: Ambulatory Visit | Attending: Family Medicine | Admitting: Family Medicine

## 2013-10-29 ENCOUNTER — Encounter (HOSPITAL_COMMUNITY): Payer: Self-pay | Admitting: *Deleted

## 2013-10-29 ENCOUNTER — Inpatient Hospital Stay (HOSPITAL_COMMUNITY): Payer: Self-pay

## 2013-10-29 DIAGNOSIS — R109 Unspecified abdominal pain: Secondary | ICD-10-CM

## 2013-10-29 DIAGNOSIS — N898 Other specified noninflammatory disorders of vagina: Secondary | ICD-10-CM | POA: Insufficient documentation

## 2013-10-29 DIAGNOSIS — O99891 Other specified diseases and conditions complicating pregnancy: Secondary | ICD-10-CM | POA: Insufficient documentation

## 2013-10-29 DIAGNOSIS — O9989 Other specified diseases and conditions complicating pregnancy, childbirth and the puerperium: Principal | ICD-10-CM

## 2013-10-29 DIAGNOSIS — O99330 Smoking (tobacco) complicating pregnancy, unspecified trimester: Secondary | ICD-10-CM | POA: Insufficient documentation

## 2013-10-29 LAB — CBC
HEMATOCRIT: 35.3 % — AB (ref 36.0–46.0)
HEMOGLOBIN: 12.3 g/dL (ref 12.0–15.0)
MCH: 34.2 pg — ABNORMAL HIGH (ref 26.0–34.0)
MCHC: 34.8 g/dL (ref 30.0–36.0)
MCV: 98.1 fL (ref 78.0–100.0)
Platelets: 280 10*3/uL (ref 150–400)
RBC: 3.6 MIL/uL — ABNORMAL LOW (ref 3.87–5.11)
RDW: 13.3 % (ref 11.5–15.5)
WBC: 8.5 10*3/uL (ref 4.0–10.5)

## 2013-10-29 LAB — POCT PREGNANCY, URINE: PREG TEST UR: POSITIVE — AB

## 2013-10-29 LAB — WET PREP, GENITAL
Clue Cells Wet Prep HPF POC: NONE SEEN
Trich, Wet Prep: NONE SEEN
YEAST WET PREP: NONE SEEN

## 2013-10-29 LAB — URINALYSIS, ROUTINE W REFLEX MICROSCOPIC
Bilirubin Urine: NEGATIVE
GLUCOSE, UA: NEGATIVE mg/dL
HGB URINE DIPSTICK: NEGATIVE
Ketones, ur: NEGATIVE mg/dL
Leukocytes, UA: NEGATIVE
Nitrite: NEGATIVE
PROTEIN: NEGATIVE mg/dL
Specific Gravity, Urine: <1.005 — ABNORMAL LOW (ref 1.005–1.030)
Urobilinogen, UA: 0.2 mg/dL (ref 0.0–1.0)
pH: 6.5 (ref 5.0–8.0)

## 2013-10-29 LAB — ABO/RH: ABO/RH(D): O POS

## 2013-10-29 LAB — HCG, QUANTITATIVE, PREGNANCY: HCG, BETA CHAIN, QUANT, S: 79523 m[IU]/mL — AB (ref <5)

## 2013-10-29 NOTE — MAU Provider Note (Signed)
History     CSN: [id number]  Arrival date and time: 10/29/13 1338   First Provider Initiated Contact with Patient 10/29/13 1759      Chief Complaint  Patient presents with  . Abdominal Pain   HPIpt is 5150w0d pregnant with confirmed IUP.  Pt is G4P0030 with first trimester losses.  Pt c/o of lower abdominal pain at night and  Vaginal discharge.  Pt is being treated for BV.  Pt denies spotting, or bleeding. RN note: Pain in lower abd last few days. Mainly at night when goes to bed. Denies pain now. No bleeding. Does have some d/c- is on rx for BV   Past Medical History  Diagnosis Date  . No pertinent past medical history   . Medical history non-contributory     History reviewed. No pertinent past surgical history.  Family History  Problem Relation Age of Onset  . Asthma Mother   . Diabetes Mother   . Alcohol abuse Mother   . Cancer Father   . Cancer Paternal Uncle   . Cancer Paternal Grandmother     History  Substance Use Topics  . Smoking status: Current Every Day Smoker -- 0.25 packs/day  . Smokeless tobacco: Not on file  . Alcohol Use: 1.2 oz/week    2 Cans of beer per week     Comment: stopped with pregnancy    Allergies: No Known Allergies  Prescriptions prior to admission  Medication Sig Dispense Refill  . PRESCRIPTION MEDICATION Take 1 tablet by mouth 2 (two) times daily. For bacterial infection Could not call pharmacy because [facility]        Review of Systems  Constitutional: Negative for fever and chills.  Gastrointestinal: Positive for abdominal pain. Negative for nausea, vomiting, diarrhea and constipation.  Genitourinary: Negative for dysuria and urgency.   Physical Exam   Blood pressure 115/60, pulse 89, temperature 98.8 F (37.1 C), temperature source Oral, resp. rate 18, last menstrual period 03/31/2013.  Physical Exam  Nursing note and vitals reviewed. Constitutional: She is oriented to person, place, and time. She  appears well-developed and well-nourished. No distress.  HENT:  Head: Normocephalic.  Eyes: Pupils are equal, round, and reactive to light.  Neck: Normal range of motion. Neck supple.  Cardiovascular: Normal rate.   Respiratory: Effort normal.  GI: Soft.  Genitourinary: Vagina normal.  Vagina clean, cervix closed, utrus 10 weeks size; FHT with doppler noted  Musculoskeletal: Normal range of motion.  Neurological: She is alert and oriented to person, place, and time.  Skin: Skin is warm and dry.  Psychiatric: She has a normal mood and affect.    MAU Course  Procedures  Results for orders placed during the hospital encounter of 10/29/13 (from the past 24 hour(s))  URINALYSIS, ROUTINE W REFLEX MICROSCOPIC     Status: Abnormal   Collection Time    10/29/13  2:42 PM      Result Value Ref Range   Color, Urine YELLOW  YELLOW   APPearance CLEAR  CLEAR   Specific Gravity, Urine <1.005 (*) 1.005 - 1.030   pH 6.5  5.0 - 8.0   Glucose, UA NEGATIVE  NEGATIVE mg/dL   Hgb urine dipstick NEGATIVE  NEGATIVE   Bilirubin Urine NEGATIVE  NEGATIVE   Ketones, ur NEGATIVE  NEGATIVE mg/dL   Protein, ur NEGATIVE  NEGATIVE mg/dL   Urobilinogen, UA 0.2  0.0 - 1.0 mg/dL   Nitrite NEGATIVE  NEGATIVE   Leukocytes, UA  NEGATIVE  NEGATIVE  POCT PREGNANCY, URINE     Status: Abnormal   Collection Time    10/29/13  2:56 PM      Result Value Ref Range   Preg Test, Ur POSITIVE (*) NEGATIVE  HCG, QUANTITATIVE, PREGNANCY     Status: Abnormal   Collection Time    10/29/13  3:54 PM      Result Value Ref Range   hCG, Beta [place], [place] [address] (*) <5 mIU/mL  CBC     Status: Abnormal   Collection Time    10/29/13  3:55 PM      Result Value Ref Range   WBC 8.5  4.0 - 10.5 K/uL   RBC 3.60 (*) 3.87 - 5.11 MIL/uL   Hemoglobin 12.3  12.0 - 15.0 g/dL   HCT 60.4 (*) 54.0 - 98.1 %   MCV 98.1  78.0 - 100.0 fL   MCH 34.2 (*) 26.0 - 34.0 pg   MCHC 34.8  30.0 - 36.0 g/dL   RDW 19.1  47.8 - 29.5 %   Platelets 280   150 - 400 K/uL  ABO/RH     Status: None   Collection Time    10/29/13  3:55 PM      Result Value Ref Range   ABO/RH(D) O POS    WET PREP, GENITAL     Status: Abnormal   Collection Time    10/29/13  5:57 PM      Result Value Ref Range   Yeast Wet Prep HPF POC NONE SEEN  NONE SEEN   Trich, Wet Prep NONE SEEN  NONE SEEN   Clue Cells Wet Prep HPF POC NONE SEEN  NONE SEEN   WBC, Wet Prep HPF POC FEW (*) NONE SEEN  US Ob Comp Less 14 Wks  10/29/2013   CLINICAL DATA:  Positive pregnancy test with lower abdominal pain.  EXAM: OBSTETRIC <14 WK ULTRASOUND  TECHNIQUE: Transabdominal ultrasound was performed for evaluation of the gestation as well as the maternal uterus and adnexal regions.  COMPARISON:  02/19/2011  FINDINGS: Intrauterine gestational sac: Visualized/normal in shape.  Yolk sac:  Present  Embryo:  Present  Cardiac Activity: Present  Heart Rate: 169 bpm  CRL:   30  mm   10 w 0 d                  [place] EDC: 05/26/2013  Maternal uterus/adnexae: Mild uterine wall thickening probably related to the orientation of the uterus. No clear evidence for a subchorionic hemorrhage. Normal appearance the left ovary measuring 3.0 x 1.4 x 2.5 cm. Right ovary measures 6.4 x 4.1 x 5.6 cm. There is a round anechoic structure in the right ovary that measures up to 5.0 cm and compatible with a cyst.  IMPRESSION: Single live intrauterine pregnancy. Calculated gestational age by ultrasound is 10 weeks and 0 days.  Right ovarian cyst measuring up to 5.0 cm.   Electronically Signed   By: Richarda Overlie M.D.   On: 10/29/2013 16:43   Discussed with Dr. Marice Potter Assessment and Plan  abd pain in pregnancy- positive FHT F/u with OB care  [facility] 10/29/2013, 6:33 PM

## 2013-10-29 NOTE — MAU Note (Signed)
Pain in lower abd last few days. Mainly at night when goes to bed. Denies pain now. No bleeding. Does have some d/c- is on rx for BV

## 2014-01-11 ENCOUNTER — Encounter (HOSPITAL_COMMUNITY): Payer: Self-pay | Admitting: *Deleted

## 2014-03-01 ENCOUNTER — Other Ambulatory Visit (HOSPITAL_COMMUNITY): Payer: Self-pay | Admitting: Physician Assistant

## 2014-03-01 DIAGNOSIS — Z3689 Encounter for other specified antenatal screening: Secondary | ICD-10-CM

## 2014-03-01 LAB — HEMOGLOBIN EVAL RFX ELECTROPHORESIS: Hemoglobin Evaluation: NORMAL

## 2014-03-08 ENCOUNTER — Ambulatory Visit (HOSPITAL_COMMUNITY)
Admission: RE | Admit: 2014-03-08 | Discharge: 2014-03-08 | Disposition: A | Discharge status: Home / Self Care | Payer: Medicaid Other | Source: Ambulatory Visit | Attending: Physician Assistant | Admitting: Physician Assistant

## 2014-03-08 DIAGNOSIS — Z3A28 28 weeks gestation of pregnancy: Secondary | ICD-10-CM | POA: Insufficient documentation

## 2014-03-08 DIAGNOSIS — Z36 Encounter for antenatal screening of mother: Secondary | ICD-10-CM | POA: Insufficient documentation

## 2014-03-08 DIAGNOSIS — Z3689 Encounter for other specified antenatal screening: Secondary | ICD-10-CM

## 2014-03-09 DIAGNOSIS — Z3A28 28 weeks gestation of pregnancy: Secondary | ICD-10-CM | POA: Insufficient documentation

## 2014-03-09 DIAGNOSIS — Z3689 Encounter for other specified antenatal screening: Secondary | ICD-10-CM | POA: Insufficient documentation

## 2014-03-12 NOTE — L&D Delivery Note (Signed)
Delivery Summary for Terri PallEbony M Herrera  Labor Events:   Preterm labor:   Rupture date: 05/24/2014   Rupture time:   Rupture type: Artificial  Fluid Color: Moderate Meconium  Induction:   Augmentation: Pitocin  Complications: Variable Decels  Cervical ripening:          Delivery:   Episiotomy:   Lacerations: 2nd degree perineal and bilateral labial lacerations  Repair suture: 3-0 Vicryl   Repair # of packets: 1  Blood loss (ml): 100cc   Information for the patient's newborn:  Toney RakesJones, Girl [name] [[id number]][[id number]]    Delivery 05/24/2014 4:03 PM by  Vaginal, Spontaneous Delivery Sex:  female Gestational Age: 1469w4d Delivery Clinician:  Elmore GuiseLori A Clemmons Living?: Yes        APGARS  One minute Five minutes Ten minutes  Skin color: 0   1      Heart rate: 2   2      Grimace: 2   2      Muscle tone: 2   2      Breathing: 2   2      Totals: 8  9      Presentation/position: Vertex  Left Occiput Anterior Resuscitation: None  Cord information: 3 vessels   Disposition of cord blood: No    Blood gases sent? No Complications: None  Placenta: Delivered: 05/24/2014 4:16 PM  Spontaneous  Intact appearance Newborn Measurements: Weight:    Height:    Head circumference:    Chest circumference:    Other providers:    Lilian Kapuraney Gagnon Mary C Early Scheryl MartenKatherine L Forsell  Additional  information: Forceps:   Vacuum:   Breech:   Observed anomalies

## 2014-05-24 ENCOUNTER — Inpatient Hospital Stay (HOSPITAL_COMMUNITY): Payer: Medicaid Other | Admitting: Anesthesiology

## 2014-05-24 ENCOUNTER — Encounter (HOSPITAL_COMMUNITY): Payer: Self-pay | Admitting: *Deleted

## 2014-05-24 ENCOUNTER — Inpatient Hospital Stay (HOSPITAL_COMMUNITY)
Admission: AD | Admit: 2014-05-24 | Discharge: 2014-05-26 | DRG: 775 | Disposition: A | Discharge status: Home / Self Care | Payer: Medicaid Other | Source: Ambulatory Visit | Attending: Obstetrics and Gynecology | Admitting: Obstetrics and Gynecology

## 2014-05-24 DIAGNOSIS — F17210 Nicotine dependence, cigarettes, uncomplicated: Secondary | ICD-10-CM | POA: Diagnosis present

## 2014-05-24 DIAGNOSIS — O99824 Streptococcus B carrier state complicating childbirth: Principal | ICD-10-CM | POA: Diagnosis present

## 2014-05-24 DIAGNOSIS — Z3A39 39 weeks gestation of pregnancy: Secondary | ICD-10-CM | POA: Diagnosis present

## 2014-05-24 DIAGNOSIS — O99314 Alcohol use complicating childbirth: Secondary | ICD-10-CM | POA: Diagnosis present

## 2014-05-24 DIAGNOSIS — Z3483 Encounter for supervision of other normal pregnancy, third trimester: Secondary | ICD-10-CM | POA: Diagnosis present

## 2014-05-24 DIAGNOSIS — O99334 Smoking (tobacco) complicating childbirth: Secondary | ICD-10-CM | POA: Diagnosis present

## 2014-05-24 LAB — CBC
HCT: 31.3 % — ABNORMAL LOW (ref 36.0–46.0)
Hemoglobin: 10.8 g/dL — ABNORMAL LOW (ref 12.0–15.0)
MCH: 33 pg (ref 26.0–34.0)
MCHC: 34.5 g/dL (ref 30.0–36.0)
MCV: 95.7 fL (ref 78.0–100.0)
Platelets: 252 10*3/uL (ref 150–400)
RBC: 3.27 MIL/uL — ABNORMAL LOW (ref 3.87–5.11)
RDW: 12.7 % (ref 11.5–15.5)
WBC: 7.9 10*3/uL (ref 4.0–10.5)

## 2014-05-24 LAB — OB RESULTS CONSOLE GBS: GBS: POSITIVE

## 2014-05-24 LAB — OB RESULTS CONSOLE HIV ANTIBODY (ROUTINE TESTING): HIV: NONREACTIVE

## 2014-05-24 LAB — OB RESULTS CONSOLE RPR: RPR: NONREACTIVE

## 2014-05-24 LAB — OB RESULTS CONSOLE RUBELLA ANTIBODY, IGM: RUBELLA: IMMUNE

## 2014-05-24 LAB — TYPE AND SCREEN
ABO/RH(D): O POS
Antibody Screen: NEGATIVE

## 2014-05-24 LAB — OB RESULTS CONSOLE HEPATITIS B SURFACE ANTIGEN: Hepatitis B Surface Ag: NEGATIVE

## 2014-05-24 LAB — ETHANOL: Alcohol, Ethyl (B): <5 mg/dL (ref 0–9)

## 2014-05-24 MED ORDER — LANOLIN HYDROUS EX OINT: TOPICAL_OINTMENT | CUTANEOUS | Status: DC | PRN

## 2014-05-24 MED ORDER — ONDANSETRON HCL 4 MG/2ML IJ SOLN: 4.0000 mg | Freq: Four times a day (QID) | INTRAMUSCULAR | Status: DC | PRN

## 2014-05-24 MED ORDER — OXYCODONE-ACETAMINOPHEN 5-325 MG PO TABS
2.0000 | ORAL_TABLET | ORAL | Status: DC | PRN
Start: 2014-05-24 — End: 2014-05-26
  Administered 2014-05-26: 2 via ORAL
  Filled 2014-05-24: qty 2

## 2014-05-24 MED ORDER — OXYTOCIN BOLUS FROM INFUSION: 500.0000 mL | INTRAVENOUS | Status: DC

## 2014-05-24 MED ORDER — ZOLPIDEM TARTRATE 5 MG PO TABS: 5.0000 mg | ORAL_TABLET | Freq: Every evening | ORAL | Status: DC | PRN

## 2014-05-24 MED ORDER — ONDANSETRON HCL 4 MG/2ML IJ SOLN: 4.0000 mg | INTRAMUSCULAR | Status: DC | PRN

## 2014-05-24 MED ORDER — OXYTOCIN 40 UNITS IN LACTATED RINGERS INFUSION - SIMPLE MED
1.0000 m[IU]/min | INTRAVENOUS | Status: DC
  Administered 2014-05-24: 1 m[IU]/min via INTRAVENOUS

## 2014-05-24 MED ORDER — LACTATED RINGERS IV SOLN
INTRAVENOUS | Status: DC
Start: 2014-05-24 — End: 2014-05-24

## 2014-05-24 MED ORDER — DIPHENHYDRAMINE HCL 25 MG PO CAPS: 25.0000 mg | ORAL_CAPSULE | Freq: Four times a day (QID) | ORAL | Status: DC | PRN

## 2014-05-24 MED ORDER — OXYCODONE-ACETAMINOPHEN 5-325 MG PO TABS
1.0000 | ORAL_TABLET | ORAL | Status: DC | PRN
  Administered 2014-05-25 (×3): 1 via ORAL
  Filled 2014-05-24 (×3): qty 1

## 2014-05-24 MED ORDER — TETANUS-DIPHTH-ACELL PERTUSSIS 5-2.5-18.5 LF-MCG/0.5 IM SUSP
0.5000 mL | Freq: Once | INTRAMUSCULAR | Status: DC
  Filled 2014-05-24: qty 0.5

## 2014-05-24 MED ORDER — OXYCODONE-ACETAMINOPHEN 5-325 MG PO TABS: 1.0000 | ORAL_TABLET | ORAL | Status: DC | PRN

## 2014-05-24 MED ORDER — OXYCODONE-ACETAMINOPHEN 5-325 MG PO TABS: 2.0000 | ORAL_TABLET | ORAL | Status: DC | PRN

## 2014-05-24 MED ORDER — FENTANYL 2.5 MCG/ML BUPIVACAINE 1/10 % EPIDURAL INFUSION (WH - ANES)
INTRAMUSCULAR | Status: AC
  Administered 2014-05-24: 12:00:00
  Filled 2014-05-24: qty 125

## 2014-05-24 MED ORDER — LACTATED RINGERS IV SOLN
INTRAVENOUS | Status: DC
Start: 2014-05-24 — End: 2014-05-24
  Administered 2014-05-24: 10:00:00 via INTRAVENOUS

## 2014-05-24 MED ORDER — SENNOSIDES-DOCUSATE SODIUM 8.6-50 MG PO TABS
2.0000 | ORAL_TABLET | ORAL | Status: DC
  Administered 2014-05-24 – 2014-05-26 (×2): 2 via ORAL
  Filled 2014-05-24 (×2): qty 2

## 2014-05-24 MED ORDER — ACETAMINOPHEN 325 MG PO TABS: 650.0000 mg | ORAL_TABLET | ORAL | Status: DC | PRN

## 2014-05-24 MED ORDER — CITRIC ACID-SODIUM CITRATE 334-500 MG/5ML PO SOLN: 30.0000 mL | ORAL | Status: DC | PRN

## 2014-05-24 MED ORDER — FENTANYL 2.5 MCG/ML BUPIVACAINE 1/10 % EPIDURAL INFUSION (WH - ANES)
INTRAMUSCULAR | Status: DC | PRN
  Administered 2014-05-24: 14 mL/h via EPIDURAL

## 2014-05-24 MED ORDER — IBUPROFEN 600 MG PO TABS
600.0000 mg | ORAL_TABLET | Freq: Four times a day (QID) | ORAL | Status: DC
  Administered 2014-05-24 – 2014-05-26 (×6): 600 mg via ORAL
  Filled 2014-05-24 (×7): qty 1

## 2014-05-24 MED ORDER — PENICILLIN G POTASSIUM 5000000 UNITS IJ SOLR
5.0000 10*6.[IU] | Freq: Once | INTRAVENOUS | Status: AC
  Administered 2014-05-24: 5 10*6.[IU] via INTRAVENOUS
  Filled 2014-05-24: qty 5

## 2014-05-24 MED ORDER — LIDOCAINE HCL (PF) 1 % IJ SOLN
INTRAMUSCULAR | Status: DC | PRN
  Administered 2014-05-24 (×2): 4 mL

## 2014-05-24 MED ORDER — PENICILLIN G POTASSIUM 5000000 UNITS IJ SOLR
2.5000 10*6.[IU] | INTRAVENOUS | Status: DC
  Administered 2014-05-24: 2.5 10*6.[IU] via INTRAVENOUS
  Filled 2014-05-24 (×4): qty 2.5

## 2014-05-24 MED ORDER — BENZOCAINE-MENTHOL 20-0.5 % EX AERO
1.0000 "application " | INHALATION_SPRAY | CUTANEOUS | Status: DC | PRN
  Administered 2014-05-24: 1 via TOPICAL
  Filled 2014-05-24 (×2): qty 56

## 2014-05-24 MED ORDER — PHENYLEPHRINE 40 MCG/ML (10ML) SYRINGE FOR IV PUSH (FOR BLOOD PRESSURE SUPPORT)
PREFILLED_SYRINGE | INTRAVENOUS | Status: AC
  Filled 2014-05-24: qty 20

## 2014-05-24 MED ORDER — WITCH HAZEL-GLYCERIN EX PADS: 1.0000 "application " | MEDICATED_PAD | CUTANEOUS | Status: DC | PRN

## 2014-05-24 MED ORDER — OXYTOCIN 40 UNITS IN LACTATED RINGERS INFUSION - SIMPLE MED
62.5000 mL/h | INTRAVENOUS | Status: DC
  Administered 2014-05-24: 999 mL/h via INTRAVENOUS
  Filled 2014-05-24: qty 1000

## 2014-05-24 MED ORDER — LACTATED RINGERS IV SOLN
500.0000 mL | INTRAVENOUS | Status: DC | PRN
  Administered 2014-05-24: 1000 mL via INTRAVENOUS
  Administered 2014-05-24: 500 mL via INTRAVENOUS

## 2014-05-24 MED ORDER — PRENATAL MULTIVITAMIN CH
1.0000 | ORAL_TABLET | Freq: Every day | ORAL | Status: DC
  Administered 2014-05-26: 1 via ORAL
  Filled 2014-05-24: qty 1

## 2014-05-24 MED ORDER — SIMETHICONE 80 MG PO CHEW: 80.0000 mg | CHEWABLE_TABLET | ORAL | Status: DC | PRN

## 2014-05-24 MED ORDER — FLEET ENEMA 7-19 GM/118ML RE ENEM: 1.0000 | ENEMA | RECTAL | Status: DC | PRN

## 2014-05-24 MED ORDER — DIBUCAINE 1 % RE OINT
1.0000 | TOPICAL_OINTMENT | RECTAL | Status: DC | PRN
Start: 2014-05-24 — End: 2014-05-26
  Filled 2014-05-24: qty 28

## 2014-05-24 MED ORDER — TERBUTALINE SULFATE 1 MG/ML IJ SOLN
0.2500 mg | Freq: Once | INTRAMUSCULAR | Status: AC | PRN
  Administered 2014-05-24: 0.25 mg via SUBCUTANEOUS
  Filled 2014-05-24: qty 1

## 2014-05-24 MED ORDER — FENTANYL CITRATE 0.05 MG/ML IJ SOLN
100.0000 ug | Freq: Once | INTRAMUSCULAR | Status: AC
Start: 2014-05-24 — End: 2014-05-24
  Administered 2014-05-24: 100 ug via INTRAVENOUS
  Filled 2014-05-24: qty 2

## 2014-05-24 MED ORDER — LIDOCAINE HCL (PF) 1 % IJ SOLN
30.0000 mL | INTRAMUSCULAR | Status: DC | PRN
  Filled 2014-05-24: qty 30

## 2014-05-24 MED ORDER — ONDANSETRON HCL 4 MG PO TABS: 4.0000 mg | ORAL_TABLET | ORAL | Status: DC | PRN

## 2014-05-24 NOTE — MAU Note (Signed)
Fern negative.

## 2014-05-24 NOTE — H&P (Signed)
Terri Herrera is a 24 y.o. female G4P0030 @[redacted]w[redacted]d presenting for onset of contractions since 100am. Denies vaginal bleeding, LOF. Reports positive fetal movement.She has received prenatal care through the health department and has documented alcohol use and tobacco use during this pregnancy. Occasional Variable decels with otherwise good variability and reassuring fetal strip. Maternal Medical History:  Reason for admission: Contractions.   Contractions: Onset was 6-12 hours ago.   Frequency: regular.   Duration is approximately 60 minutes.   Perceived severity is moderate.    Fetal activity: Perceived fetal activity is normal.   Last perceived fetal movement was within the past hour.    Prenatal complications: Substance abuse.     OB History    Gravida Para Term Preterm AB TAB SAB Ectopic Multiple Living   4    3  3   0     Past Medical History  Diagnosis Date  . No pertinent past medical history   . Medical history non-contributory    No past surgical history on file. Family History: family history includes Alcohol abuse in her mother; Asthma in her mother; Cancer in her father, paternal grandmother, and paternal uncle; Diabetes in her mother. Social History:  reports that she has been smoking.  She does not have any smokeless tobacco history on file. She reports that she drinks about 1.2 oz of alcohol per week. She reports that she does not use illicit drugs.   Prenatal Transfer Tool  Maternal Diabetes: No Genetic Screening: Too late Maternal Ultrasounds/Referrals: Normal Fetal Ultrasounds or other Referrals:  None Maternal Substance Abuse:  Yes:  Type: Smoker, Other: Alcohol Significant Maternal Medications:  None Significant Maternal Lab Results:  None Other Comments:  None  Review of Systems  Constitutional: Negative.   HENT: Negative.   Eyes: Negative.   Respiratory: Negative.   Cardiovascular: Negative.   Gastrointestinal: Positive for abdominal pain.   Genitourinary: Negative.   Musculoskeletal: Negative.   Skin: Negative.   Neurological: Negative.   Endo/Heme/Allergies: Negative.   Psychiatric/Behavioral: Negative.     Dilation: 3 Effacement (%): 70 Station: -1, -2 Exam by:: s grindstaff rn Blood pressure 125/77, pulse 88, temperature 98.2 F (36.8 C), temperature source Oral, resp. rate 20, height 5' 4" (1.626 m), weight 64.275 kg (141 lb 11.2 oz), last menstrual period 03/31/2013. Maternal Exam:  Uterine Assessment: Contraction strength is moderate.  Contraction duration is 60 seconds. Contraction frequency is regular.   Abdomen: Patient reports no abdominal tenderness. Estimated fetal weight is 6 pounds.   Fetal presentation: vertex  Introitus: Normal vulva. Normal vagina.    Fetal Exam Fetal Monitor Review: Mode: ultrasound.   Variability: moderate (6-25 bpm).   Pattern: accelerations present and variable decelerations.    Fetal State Assessment: Category I - tracings are normal.     Physical Exam  Nursing note and vitals reviewed. Constitutional: She is oriented to person, place, and time. She appears well-developed and well-nourished. No distress.  HENT:  Head: Normocephalic and atraumatic.  Neck: Normal range of motion.  Cardiovascular: Normal rate and regular rhythm.   Respiratory: Effort normal. No respiratory distress.  GI: Soft.  Genitourinary: Vagina normal.  Musculoskeletal: Normal range of motion.  Neurological: She is alert and oriented to person, place, and time.  Skin: Skin is warm and dry.  Psychiatric: She has a normal mood and affect. Her behavior is normal. Judgment and thought content normal.    Prenatal labs: ABO, Rh: --/--/O POS (08/20 1555) Antibody:  neg Rubella:    immune RPR:   neg HBsAg:   neg HIV:   neg GBS:     [facility] Prenatal Labs  Dating U/S 10/29/13 @ [redacted]w[redacted]d c/w edd Blood type: --/--/O POS (08/20 1555)   Genetic Screen Too late Antibody: neg  Anatomic US  normal Rubella:  Immune  GTT Early:  91             Third trimester:  RPR:   neg  Flu vaccine  HBsAg:   neg  TDaP vaccine                                               Rhogam: HIV:   neg  GBS    Positive                                   (For PCN allergy, check sensitivities) GBS:   Contraception  Pap:  Baby Food    Circumcision    Pediatrician    Support Person      Assessment/Plan: IUP @ 39+4 Alcohol and Tobacco use during pregnancy GBS Positive Variable Decels  Plan: Admit to L/d GBS Prophylaxis Epidural Anticipate Vaginal Delivery      Clemmons,Lori Grissett 05/24/2014, 10:07 AM     

## 2014-05-24 NOTE — Anesthesia Preprocedure Evaluation (Signed)
Anesthesia Evaluation  Patient identified by MRN, date of birth, ID band Patient awake    Reviewed: Allergy & Precautions, Patient's Chart, lab work & pertinent test results  Airway        Dental   Pulmonary Current Smoker,          Cardiovascular negative cardio ROS      Neuro/Psych negative neurological ROS     GI/Hepatic Neg liver ROS, GERD-  ,  Endo/Other  negative endocrine ROS  Renal/GU negative Renal ROS  negative genitourinary   Musculoskeletal negative musculoskeletal ROS (+)   Abdominal   Peds  Hematology negative hematology ROS (+)   Anesthesia Other Findings   Reproductive/Obstetrics (+) Pregnancy                             Anesthesia Physical Anesthesia Plan  ASA: II  Anesthesia Plan: Epidural   Post-op Pain Management:    Induction:   Airway Management Planned: Natural Airway  Additional Equipment:   Intra-op Plan:   Post-operative Plan:   Informed Consent: I have reviewed the patients History and Physical, chart, labs and discussed the procedure including the risks, benefits and alternatives for the proposed anesthesia with the patient or authorized representative who has indicated his/her understanding and acceptance.     Plan Discussed with: Anesthesiologist  Anesthesia Plan Comments:         Anesthesia Quick Evaluation

## 2014-05-24 NOTE — MAU Note (Signed)
Started leaking fluid about 0100.Pt states fluid whit in color.

## 2014-05-24 NOTE — Progress Notes (Signed)
Dr Junita Pushumbly notitied pt in MAU.  R/o labor r/o possible SROM

## 2014-05-24 NOTE — Anesthesia Procedure Notes (Addendum)
Epidural Patient location during procedure: OB Start time: 05/24/2014 11:53 AM  Staffing Anesthesiologist: Mal AmabileFOSTER, Trygg Mantz Performed by: anesthesiologist   Preanesthetic Checklist Completed: patient identified, site marked, surgical consent, pre-op evaluation, timeout performed, IV checked, risks and benefits discussed and monitors and equipment checked  Epidural Patient position: sitting Prep: site prepped and draped and DuraPrep Patient monitoring: continuous pulse ox and blood pressure Approach: midline Location: L4-L5 Injection technique: LOR air  Needle:  Needle type: Tuohy  Needle gauge: 17 G Needle length: 9 cm and 9 Needle insertion depth: 4 cm Catheter type: closed end flexible Catheter size: 19 Gauge Catheter at skin depth: 9 cm Test dose: negative and Other  Assessment Events: blood not aspirated, injection not painful, no injection resistance, negative IV test and no paresthesia  Additional Notes Patient identified. Risks and benefits discussed including failed block, incomplete  Pain control, post dural puncture headache, nerve damage, paralysis, blood pressure Changes, nausea, vomiting, reactions to medications-both toxic and allergic and post Partum back pain. All questions were answered. Patient expressed understanding and wished to proceed. Sterile technique was used throughout procedure. Epidural site was Dressed with sterile barrier dressing. No paresthesias, signs of intravascular injection Or signs of intrathecal spread were encountered.  Patient was more comfortable after the epidural was dosed. Please see RN's note for documentation of vital signs and FHR which are stable.

## 2014-05-24 NOTE — MAU Note (Signed)
Pt states here for LOF that began around 0100 this am. Contractions now q5 minutes.

## 2014-05-24 NOTE — Progress Notes (Signed)
Terri Herrera is a 24 y.o. G4P0030 at 5212w4d by ultrasound admitted for active labor, Variable decelerations  Subjective: Comfortable with epidural; no complaints. ABX dose 1 complete  Objective: BP 109/59 mmHg  Pulse 74  Temp(Src) 97.6 F (36.4 C) (Oral)  Resp 18  Ht 5\' 4"  (1.626 m)  Wt 64.275 kg (141 lb 11.2 oz)  BMI 24.31 kg/m2  LMP 03/31/2013      FHT:  FHR: 140 bpm, variability: moderate,  accelerations:  Present,  decelerations:  Absent UC:   regular, every 4 minutes SVE:   Dilation: 4 Effacement (%): 70 Station: -1 Exam by:: r. gagnon rnc  Labs: Lab Results  Component Value Date   WBC 7.9 05/24/2014   HGB 10.8* 05/24/2014   HCT 31.3* 05/24/2014   MCV 95.7 05/24/2014   PLT 252 05/24/2014   Results for orders placed or performed during the hospital encounter of 05/24/14 (from the past 24 hour(s))  Type and screen     Status: None   Collection Time: 05/24/14 10:00 AM  Result Value Ref Range   ABO/RH(D) O POS    Antibody Screen NEG    Sample Expiration 05/27/2014   CBC     Status: Abnormal   Collection Time: 05/24/14 10:00 AM  Result Value Ref Range   WBC 7.9 4.0 - 10.5 K/uL   RBC 3.27 (L) 3.87 - 5.11 MIL/uL   Hemoglobin 10.8 (L) 12.0 - 15.0 g/dL   HCT 02.731.3 (L) 25.336.0 - 66.446.0 %   MCV 95.7 78.0 - 100.0 fL   MCH 33.0 26.0 - 34.0 pg   MCHC 34.5 30.0 - 36.0 g/dL   RDW 40.312.7 47.411.5 - 25.915.5 %   Platelets 252 150 - 400 K/uL  OB RESULT CONSOLE Group B Strep     Status: None   Collection Time: 05/24/14 11:03 AM  Result Value Ref Range   GBS Positive   OB RESULTS CONSOLE RPR     Status: None   Collection Time: 05/24/14 11:03 AM  Result Value Ref Range   RPR Nonreactive   OB RESULTS CONSOLE HIV antibody     Status: None   Collection Time: 05/24/14 11:03 AM  Result Value Ref Range   HIV Non-reactive   OB RESULTS CONSOLE Rubella Antibody     Status: None   Collection Time: 05/24/14 11:03 AM  Result Value Ref Range   Rubella Immune   OB RESULTS CONSOLE Hepatitis B  surface antigen     Status: None   Collection Time: 05/24/14 11:03 AM  Result Value Ref Range   Hepatitis B Surface Ag Negative     Assessment / Plan: Augmentation of labor, progressing well  Labor: Will start Pitocin augmentation and then AROM; GBS Prophylaxis Preeclampsia:  n/a Fetal Wellbeing:  Category I Pain Control:  Epidural I/D:  n/a Anticipated MOD:  NSVD  Clemmons,Lori Grissett 05/24/2014, 1:13 PM

## 2014-05-24 NOTE — MAU Provider Note (Signed)
Terri Herrera is a 24 y.o. female 404P0030 @[redacted]w[redacted]d  presenting for onset of contractions since 100am. Denies vaginal bleeding, LOF. Reports positive fetal movement.She has received prenatal care through the health department and has documented alcohol use and tobacco use during this pregnancy. Occasional Variable decels with otherwise good variability and reassuring fetal strip. Maternal Medical History:  Reason for admission: Contractions.   Contractions: Onset was 6-12 hours ago.   Frequency: regular.   Duration is approximately 60 minutes.   Perceived severity is moderate.    Fetal activity: Perceived fetal activity is normal.   Last perceived fetal movement was within the past hour.    Prenatal complications: Substance abuse.     OB History    Gravida Para Term Preterm AB TAB SAB Ectopic Multiple Living   4    3  3    0     Past Medical History  Diagnosis Date  . No pertinent past medical history   . Medical history non-contributory    No past surgical history on file. Family History: family history includes Alcohol abuse in her mother; Asthma in her mother; Cancer in her father, paternal grandmother, and paternal uncle; Diabetes in her mother. Social History:  reports that she has been smoking.  She does not have any smokeless tobacco history on file. She reports that she drinks about 1.2 oz of alcohol per week. She reports that she does not use illicit drugs.   Prenatal Transfer Tool  Maternal Diabetes: No Genetic Screening: Too late Maternal Ultrasounds/Referrals: Normal Fetal Ultrasounds or other Referrals:  None Maternal Substance Abuse:  Yes:  Type: Smoker, Other: Alcohol Significant Maternal Medications:  None Significant Maternal Lab Results:  None Other Comments:  None  Review of Systems  Constitutional: Negative.   HENT: Negative.   Eyes: Negative.   Respiratory: Negative.   Cardiovascular: Negative.   Gastrointestinal: Positive for abdominal pain.   Genitourinary: Negative.   Musculoskeletal: Negative.   Skin: Negative.   Neurological: Negative.   Endo/Heme/Allergies: Negative.   Psychiatric/Behavioral: Negative.     Dilation: 3 Effacement (%): 70 Station: -1, -2 Exam by:: s grindstaff rn Blood pressure 125/77, pulse 88, temperature 98.2 F (36.8 C), temperature source Oral, resp. rate 20, height 5\' 4"  (1.626 m), weight 64.275 kg (141 lb 11.2 oz), last menstrual period 03/31/2013. Maternal Exam:  Uterine Assessment: Contraction strength is moderate.  Contraction duration is 60 seconds. Contraction frequency is regular.   Abdomen: Patient reports no abdominal tenderness. Estimated fetal weight is 6 pounds.   Fetal presentation: vertex  Introitus: Normal vulva. Normal vagina.    Fetal Exam Fetal Monitor Review: Mode: ultrasound.   Variability: moderate (6-25 bpm).   Pattern: accelerations present and variable decelerations.    Fetal State Assessment: Category I - tracings are normal.     Physical Exam  Nursing note and vitals reviewed. Constitutional: She is oriented to person, place, and time. She appears well-developed and well-nourished. No distress.  HENT:  Head: Normocephalic and atraumatic.  Neck: Normal range of motion.  Cardiovascular: Normal rate and regular rhythm.   Respiratory: Effort normal. No respiratory distress.  GI: Soft.  Genitourinary: Vagina normal.  Musculoskeletal: Normal range of motion.  Neurological: She is alert and oriented to person, place, and time.  Skin: Skin is warm and dry.  Psychiatric: She has a normal mood and affect. Her behavior is normal. Judgment and thought content normal.    Prenatal labs: ABO, Rh: --/--/O POS (08/20 1555) Antibody:  neg Rubella:  immune RPR:   neg HBsAg:   neg HIV:   neg GBS:     [facility] Department Prenatal Labs  Dating U/S 10/29/13 @ [redacted]w[redacted]d c/w edd Blood type: --/--/O POS (08/20 1555)   Genetic Screen Too late Antibody: neg  Anatomic [place]  normal Rubella:  Immune  GTT Early:  67             Third trimester:  RPR:   neg  Flu vaccine  HBsAg:   neg  TDaP vaccine                                               Rhogam: HIV:   neg  GBS    Positive                                   (For PCN allergy, check sensitivities) GBS:   Contraception  Pap:  Baby Food    Circumcision    Pediatrician    Support Person      Assessment/Plan: IUP @ 39+4 Alcohol and Tobacco use during pregnancy GBS Positive Variable Decels  Plan: Admit to L/d GBS Prophylaxis Epidural Anticipate Vaginal Delivery      Clemmons,Lori Grissett 05/24/2014, 10:07 AM

## 2014-05-25 LAB — CBC
HCT: 27.7 % — ABNORMAL LOW (ref 36.0–46.0)
Hemoglobin: 9.6 g/dL — ABNORMAL LOW (ref 12.0–15.0)
MCH: 33.3 pg (ref 26.0–34.0)
MCHC: 34.7 g/dL (ref 30.0–36.0)
MCV: 96.2 fL (ref 78.0–100.0)
Platelets: 200 10*3/uL (ref 150–400)
RBC: 2.88 MIL/uL — ABNORMAL LOW (ref 3.87–5.11)
RDW: 12.8 % (ref 11.5–15.5)
WBC: 10.5 10*3/uL (ref 4.0–10.5)

## 2014-05-25 LAB — RPR: RPR Ser Ql: NONREACTIVE

## 2014-05-25 MED ORDER — PNEUMOCOCCAL VAC POLYVALENT 25 MCG/0.5ML IJ INJ
0.5000 mL | INJECTION | INTRAMUSCULAR | Status: AC
  Administered 2014-05-26: 0.5 mL via INTRAMUSCULAR
  Filled 2014-05-25: qty 0.5

## 2014-05-25 MED ORDER — PNEUMOCOCCAL VAC POLYVALENT 25 MCG/0.5ML IJ INJ: 0.5000 mL | INJECTION | INTRAMUSCULAR | Status: DC

## 2014-05-25 NOTE — Progress Notes (Signed)
Post Partum Day #1 Subjective:  Terri Herrera is a 24 y.o. [id number] 4420w4d s/p SVD.  No acute events overnight.  Pt denies problems with ambulating, voiding or po intake.  She denies nausea or vomiting.  Pain is well controlled.  She has had flatus. She has not had bowel movement.  Lochia Minimal.  Plan for birth control is Depo-Provera.  Method of Feeding: Bottle  Objective: Blood pressure 117/71, pulse 71, temperature 98 F (36.7 C), temperature source Oral, resp. rate 18, height 5\' 4"  (1.626 m), weight 64.275 kg (141 lb 11.2 oz), last menstrual period 03/31/2013, SpO2 100 %, unknown if currently breastfeeding.  Physical Exam:  General: alert, cooperative and no distress Lochia:normal flow Chest: CTAB, no increased work of breathing Heart: RRR no m/r/g Abdomen: soft, nontender,  Uterine Fundus: firm DVT Evaluation: No evidence of DVT seen on physical exam. Extremities: No edema   Recent Labs  05/24/14 1000 05/25/14 0545  HGB 10.8* 9.6*  HCT 31.3* 27.7*    Assessment/Plan:  ASSESSMENT: Terri Herrera is a 24 y.o. [id number] 2520w4d s/p SVD  Plan for discharge tomorrow   LOS: 1 day   Araceli BoucheRumley, [place] N 05/25/2014, 8:30 AM

## 2014-05-25 NOTE — Progress Notes (Signed)
UR chart review completed.  

## 2014-05-25 NOTE — Progress Notes (Signed)
Clinical Social Work Department PSYCHOSOCIAL ASSESSMENT - MATERNAL/CHILD 05/25/2014  Patient:  Terri Herrera, Terri Herrera  Account Number:  [id number]  [place] Date:  05/24/2014  Ardine Eng Name:   Kasandra Knudsen   Clinical Social Worker:  Lucita Ferrara, CLINICAL SOCIAL WORKER   Date/Time:  05/25/2014 03:30 PM  Date Referred:  05/24/2014   Referral source  Central Nursery     Referred reason  Substance Abuse-- etoh use during the pregnancy   Other referral source:    I:  FAMILY / HOME ENVIRONMENT Child's legal guardian:  PARENT  Guardian - Name Guardian - Age [place] 25 [address] [place], [place] [address]  Chose to not disclose name  different residence, minimally involved   Other household support members/support persons Name Relationship DOB  Mathews Robinsons MOTHER    Other support:   MOB stated that she also lives with her sister's three children (her sister lives at a different residence). She stated that her mother is at home with her during the day and will provide her with support.    II  PSYCHOSOCIAL DATA Information Source:  Patient Interview  Occupational hygienist Employment:   MOB reported that she is currently not working. She voiced intention to start looking for work once infant is able to go to day care.   Financial resources:  Medicaid If Medicaid - County:  [place] Other  [place] / Grade:  N/A Music therapist / Industrial/product designer / Early Interventions:   None reported  Cultural issues impacting care:   None reported    III  STRENGTHS Strengths  Adequate Resources  Home prepared for Child (including basic supplies)  Supportive family/friends   Strength comment:    IV  RISK FACTORS AND CURRENT PROBLEMS Current Problem:  YES   Risk Factor & Current Problem Patient Issue Family Issue Risk Factor / Current Problem Comment  Substance Abuse Y N MOB presents with etoh use during pregnancy,  even after she learned of pregnancy.         V  SOCIAL WORK ASSESSMENT CSW met with the MOB due to history of documented etoh use during this pregnancy.  Prior to meeting with MOB, [place] consulted with RN.  Per RN, MOB has been attentive and appropriate with the infant care.  RN disclosed that the [facility] also reports daily etoh consumption. Chart review also highlighted that the MOB was unable to recall date of her last menstrual cycle MOB presented as easily engaged, but she appeared closed, guarded, and vague when CSW attempted to complete assessment.  She also appeared to be attending to and responding appropriately to the infant's needs.    MOB denied questions, concerns, or needs as she transitions to the postpartum period. She reported prior experience caring for sister's 3 children since they live with her and her mother.  She expressed confidence in her parenting skills, and expressed readiness for discharge.  She verbalized understanding of need for infant to stay at the hospital to ensure that there is not significant weight loss due to SGA.  She denied belief that her infant was losing weight since she was eating "a lot".    MOB reported having a positive support system, and stated that the home is well prepared for the infant. She stated that the FOB is minimally involved, but stated that it is for the best. She shared that it is a stressor if he were to be  involved. She denied any safety concerns related to the FOB.  MOB denied mental health history, but agreed to contract her MD if she notes signs or symptoms.  MOB denied mental health history, but she did endorse mood lability and being easily angered during the pregnancy. She presents with poor emotional regulation skills and reported that she only smokes cigarettes to help calm her down.   MOB acknowledged etoh use during the pregnancy. She stated that she did not consume etoh intentionally once she learned of the pregnancy. She acknowledged  that she had consumed etoh while pregnant, but stated that it was due to having beverages in the fridge that she did not know were alcoholic.  She denied belief that her etoh use was problematic, and stated, "I do not really like alcohol".   Per MOB, she met with a Education officer, museum at the health department due to her etoh use as well, but she also denied to them at that time that her etoh use was a presenting problem.  Overall, she was a limited historian and was not forthcoming with her etoh use.   VI SOCIAL WORK PLAN Social Work Plan  Child Scientist, forensic Report  Patient/Family Education   Type of pt/family education:   Postpartum depression   If child protective services report - county:  [place] If child protective services report - date:  CSW to make on 3/16. CSW is concerned about etoh use during the pregnancy, MOB being closed/guarded about her use, and her not appearing concerned about potential impacts on infant due to nicotine and etoh use. Information/referral to community resources comment:   Other social work plan:   CSW to make CPS report on 3/16 since CPS was closed after assessment completed.    CSW will monitor MDS and will notify CPS if MDS is positive for substances.    CSW will follow up with CPS prior to discharge in order to receive discharge recommendations.

## 2014-05-25 NOTE — Anesthesia Postprocedure Evaluation (Signed)
Anesthesia Post Note  Patient: Terri Herrera  Procedure(s) Performed: * No procedures listed *  Anesthesia type: Epidural  Patient location: Mother/Baby  Post pain: Pain level controlled  Post assessment: Post-op Vital signs reviewed  Last Vitals:  Filed Vitals:   05/25/14 0605  BP: 117/71  Pulse: 71  Temp: 36.7 C  Resp: 18    Post vital signs: Reviewed  Level of consciousness: awake  Complications: No apparent anesthesia complications

## 2014-05-26 MED ORDER — IBUPROFEN 600 MG PO TABS: 600.0000 mg | ORAL_TABLET | Freq: Four times a day (QID) | ORAL | Status: DC

## 2014-05-26 NOTE — Discharge Summary (Signed)
Obstetric Discharge Summary Reason for Admission: onset of labor Prenatal Procedures: none Intrapartum Procedures: spontaneous vaginal delivery Postpartum Procedures: none Complications-Operative and Postpartum: 2nd degree perineal laceration  Delivery Note At 4:03 PM a healthy female was delivered via Vaginal, Spontaneous Delivery (Presentation: Left Occiput Anterior).  APGAR: 8, 9; weight 4 lb 15 oz (2240 g).   Placenta status: Intact, Spontaneous.  Cord: 3 vessels with the following complications: None.   Anesthesia: Epidural  Episiotomy: None Lacerations: Labial;2nd degree Suture Repair: 3.0 vicryl Est. Blood Loss (mL): 100  Mom to postpartum.  Baby to Couplet care / Skin to Skin.  Hospital Course:  Active Problems:   Labor and delivery, indication for care   Spontaneous vaginal delivery   Postpartum care following vaginal delivery   Terri Herrera is a 24 y.o. [id number] s/p SVD.  Patient admitted to L&D.  She has postpartum course that was uncomplicated including no problems with ambulating, PO intake, urination, pain, or bleeding. The pt feels ready to go home and  will be discharged with outpatient follow-up.   Today: No acute events overnight.  Pt denies problems with ambulating, voiding or po intake.  She denies nausea or vomiting.  Pain is well controlled.  She has had flatus. She has had bowel movement.  Lochia Minimal.  Plan for birth control is  Depo-Provera.  Method of Feeding: Bottle   H/H: Lab Results  Component Value Date/Time   HGB 9.6* 05/25/2014 05:45 AM   HCT 27.7* 05/25/2014 05:45 AM    Discharge Diagnoses: Term Pregnancy-delivered  Discharge Information: Date: 05/26/2014 Activity: pelvic rest Diet: routine  Medications: Ibuprofen Breast feeding:  No: Bottle Condition: stable Instructions: refer to handout Discharge to: home      Medication List    TAKE these medications        ibuprofen 600 MG tablet  Commonly known as:  ADVIL,MOTRIN   Take 1 tablet (600 mg total) by mouth every 6 (six) hours.     prenatal multivitamin Tabs tablet  Take 1 tablet by mouth daily at 12 noon.           Follow-up Information    Follow up with Health Department. Schedule an appointment as soon as possible for a visit in 4 weeks.      Araceli Boucheumley, [place] N, DO  Family Medicine, PGY-1 05/26/2014,7:54 AM

## 2014-05-26 NOTE — Discharge Instructions (Signed)

## 2014-05-26 NOTE — Progress Notes (Signed)
CSW received call from CPS intake/P. Miller stating report was accepted as a 24 hour response.  Therefore, MOB and baby may be discharged when medically ready and CPS investigator will meet with them in the community.  CSW informed CN staff and MOB's bedside RN. 

## 2014-05-26 NOTE — Progress Notes (Signed)
CSW contacted [place] CPS on 3/16 at 8:10am in order to make report due to MOB's etoh use during pregnancy.    CSW will follow up with CPS prior to discharge in order to inquire about status of report and any potential barriers to discharge.  CSW will continue to follow.  

## 2015-09-29 ENCOUNTER — Encounter (HOSPITAL_COMMUNITY): Payer: Self-pay

## 2015-09-29 ENCOUNTER — Inpatient Hospital Stay (HOSPITAL_COMMUNITY)
Admission: AD | Admit: 2015-09-29 | Discharge: 2015-09-29 | Disposition: A | Discharge status: Home / Self Care | Payer: Medicaid Other | Source: Ambulatory Visit | Attending: Family Medicine | Admitting: Family Medicine

## 2015-09-29 DIAGNOSIS — N760 Acute vaginitis: Secondary | ICD-10-CM

## 2015-09-29 DIAGNOSIS — N938 Other specified abnormal uterine and vaginal bleeding: Secondary | ICD-10-CM | POA: Insufficient documentation

## 2015-09-29 DIAGNOSIS — B9689 Other specified bacterial agents as the cause of diseases classified elsewhere: Secondary | ICD-10-CM

## 2015-09-29 DIAGNOSIS — F17210 Nicotine dependence, cigarettes, uncomplicated: Secondary | ICD-10-CM | POA: Insufficient documentation

## 2015-09-29 LAB — URINALYSIS, ROUTINE W REFLEX MICROSCOPIC
Bilirubin Urine: NEGATIVE
Glucose, UA: NEGATIVE mg/dL
KETONES UR: NEGATIVE mg/dL
LEUKOCYTES UA: NEGATIVE
NITRITE: NEGATIVE
PROTEIN: NEGATIVE mg/dL
Specific Gravity, Urine: 1.025 (ref 1.005–1.030)
pH: 5.5 (ref 5.0–8.0)

## 2015-09-29 LAB — URINE MICROSCOPIC-ADD ON: WBC UA: NONE SEEN WBC/hpf (ref 0–5)

## 2015-09-29 LAB — WET PREP, GENITAL
SPERM: NONE SEEN
TRICH WET PREP: NONE SEEN
Yeast Wet Prep HPF POC: NONE SEEN

## 2015-09-29 LAB — CBC
HCT: 38.4 % (ref 36.0–46.0)
HEMOGLOBIN: 13.1 g/dL (ref 12.0–15.0)
MCH: 32.3 pg (ref 26.0–34.0)
MCHC: 34.1 g/dL (ref 30.0–36.0)
MCV: 94.8 fL (ref 78.0–100.0)
Platelets: 219 10*3/uL (ref 150–400)
RBC: 4.05 MIL/uL (ref 3.87–5.11)
RDW: 16.4 % — AB (ref 11.5–15.5)
WBC: 3.9 10*3/uL — ABNORMAL LOW (ref 4.0–10.5)

## 2015-09-29 LAB — POCT PREGNANCY, URINE: Preg Test, Ur: NEGATIVE

## 2015-09-29 MED ORDER — IBUPROFEN 600 MG PO TABS
600.0000 mg | ORAL_TABLET | Freq: Once | ORAL | Status: AC
  Administered 2015-09-29: 600 mg via ORAL
  Filled 2015-09-29: qty 1

## 2015-09-29 MED ORDER — METRONIDAZOLE 500 MG PO TABS: 500.0000 mg | ORAL_TABLET | Freq: Two times a day (BID) | ORAL | Status: AC

## 2015-09-29 MED ORDER — IBUPROFEN 600 MG PO TABS: 600.0000 mg | ORAL_TABLET | Freq: Four times a day (QID) | ORAL | Status: DC | PRN

## 2015-09-29 NOTE — MAU Note (Signed)
Patient called x1 not in lobby

## 2015-09-29 NOTE — MAU Provider Note (Signed)
Chief Complaint:  Vaginal Bleeding   First Provider Initiated Contact with Patient 09/29/15 1610     HPI  Terri Herrera is a 25 y.o. [id number] who presents to maternity admissions reporting vaginal bleeding x 1 day. Endorses foul smell associated with bleeding and lower abdominal pain.  Denies vaginal itching/burning, urinary symptoms, h/a, dizziness, n/v, or fever/chills.  Has not taken any for the pain but has found minimal relief with a cool water bottle.   Reports LMP was May 1st. Her periods are usually regular.  Does not use contraception. Reports was treated for gonorrhea last year.     RN Note: Pt reports she has not had period since  May. Started having vaginal bleeding last night. Went to [facility] last night and they told her to come here.Did not get evaluated there because she left  Past Medical History: Past Medical History  Diagnosis Date  . No pertinent past medical history   . Medical history non-contributory     Past obstetric history: OB History  Gravida Para Term Preterm AB SAB TAB Ectopic Multiple Living  0 1    # Outcome Date GA Lbr Len/2nd Weight Sex Delivery Anes PTL Lv  4 Term 05/24/14 [redacted]w[redacted]d 14:28 / 00:35 4 lb 15 oz (2.24 kg) F Vag-Spont EPI  Y  3 SAB           2 SAB           1 SAB              Comments: System Generated. Please review and update pregnancy details.    Obstetric Comments  Admitted ETOH daily and tobacco use during pregnancy    Past Surgical History: History reviewed. No pertinent past surgical history.  Family History: Family History  Problem Relation Age of Onset  . Asthma Mother   . Diabetes Mother   . Alcohol abuse Mother   . Cancer Father   . Cancer Paternal Uncle   . Cancer Paternal Grandmother     Social History: Social History  Substance Use Topics  . Smoking status: Current Every Day Smoker -- 0.25 packs/day  . Smokeless tobacco: Never Used  . Alcohol Use: 1.2 oz/week    2 Cans of beer per week   Comment: stopped with pregnancy    Allergies: No Known Allergies  Meds:  Prescriptions prior to admission  Medication Sig Dispense Refill Last Dose  . ibuprofen (ADVIL,MOTRIN) 600 MG tablet Take 1 tablet (600 mg total) by mouth every 6 (six) hours. 30 tablet 0   . Prenatal Vit-Fe Fumarate-FA (PRENATAL MULTIVITAMIN) TABS tablet Take 1 tablet by mouth daily at 12 noon.   05/23/2014 at Unknown time    I have reviewed patient's Past Medical Hx, Surgical Hx, Family Hx, Social Hx, medications and allergies.  ROS:  Review of Systems  Constitutional: Negative.   Respiratory: Negative.   Cardiovascular: Negative.   Gastrointestinal: Negative for nausea, vomiting, diarrhea and constipation. Abdominal pain: lower.  Genitourinary: Negative.    Other systems negative     Physical Exam   Patient Vitals for the past 24 hrs:  BP Temp Temp src Pulse Resp Height Weight  09/29/15 1356 130/96 mmHg 98.6 F (37 C) Oral 90 18  (1.626 m) 118 lb 1.9 oz (53.579 kg)   Constitutional: Well-developed, well-nourished female in no acute distress.  Cardiovascular: normal rate and rhythm, no ectopy audible, S1 & S2 heard, no murmur Respiratory:  normal effort, no distress. Lungs CTAB with no wheezes or crackles GI: Abd soft, tender to palpation in suprapubic region.  Nondistended.  No rebound, No guarding.  Bowel Sounds audible  Neurologic: Alert and oriented x 4.   Grossly nonfocal. GU: Neg CVAT. Skin:  Warm and Dry Psych:  Affect appropriate.  PELVIC EXAM: Cervix pink, visually closed, without lesion, , vaginal walls and external genitalia normal. Used 3 Fox swabs to clear blood clots to visualize cervix.  Bimanual exam: Cervix firm, anterior, neg CMT, uterus nontender, nonenlarged, adnexa with mild tenderness bilaterally, negative for enlargement or mass    Labs: Results for orders placed or performed during the hospital encounter of 09/29/15 (from the past 24 hour(s))  Urinalysis, Routine w  reflex microscopic ([facility])     Status: Abnormal   Collection Time: 09/29/15  1:40 PM  Result Value Ref Range   Color, Urine YELLOW YELLOW   APPearance CLOUDY (A) CLEAR   Specific Gravity, Urine 1.025 1.005 - 1.030   pH 5.5 5.0 - 8.0   Glucose, UA NEGATIVE NEGATIVE mg/dL   Hgb urine dipstick LARGE (A) NEGATIVE   Bilirubin Urine NEGATIVE NEGATIVE   Ketones, ur NEGATIVE NEGATIVE mg/dL   Protein, ur NEGATIVE NEGATIVE mg/dL   Nitrite NEGATIVE NEGATIVE   Leukocytes, UA NEGATIVE NEGATIVE  Urine microscopic-add on     Status: Abnormal   Collection Time: 09/29/15  1:40 PM  Result Value Ref Range   Squamous Epithelial / LPF 0-5 (A) NONE SEEN   WBC, UA NONE SEEN 0 - 5 WBC/hpf   RBC / HPF TOO NUMEROUS TO COUNT 0 - 5 RBC/hpf   Bacteria, UA RARE (A) NONE SEEN  Pregnancy, urine POC     Status: None   Collection Time: 09/29/15  3:19 PM  Result Value Ref Range   Preg Test, Ur NEGATIVE NEGATIVE  Wet prep, genital     Status: Abnormal   Collection Time: 09/29/15  4:40 PM  Result Value Ref Range   Yeast Wet Prep HPF POC NONE SEEN NONE SEEN   Trich, Wet Prep NONE SEEN NONE SEEN   Clue Cells Wet Prep HPF POC PRESENT (A) NONE SEEN   WBC, Wet Prep HPF POC FEW (A) NONE SEEN   Sperm NONE SEEN   CBC     Status: Abnormal   Collection Time: 09/29/15  4:42 PM  Result Value Ref Range   WBC 3.9 (L) 4.0 - 10.5 K/uL   RBC 4.05 3.87 - 5.11 MIL/uL   Hemoglobin 13.1 12.0 - 15.0 g/dL   HCT 16.138.4 09.636.0 - 04.546.0 %   MCV 94.8 78.0 - 100.0 fL   MCH 32.3 26.0 - 34.0 pg   MCHC 34.1 30.0 - 36.0 g/dL   RDW 40.916.4 (H) 81.111.5 - 91.415.5 %   Platelets 219 150 - 400 K/uL     MAU Course/MDM: I have ordered labs as follows:  CBC, HIV, Chlamydia and Gonorrhea, and Wet prep and Pap smear Results reviewed.  Pelvic exam performed.  Treatments in MAU included 600 mg Ibuprofen.   Pt stable at time of discharge.  Assessment: Dysfunctional Uterine Bleeding, likely due to anovulatory cycle   Plan: Discharge home Recommend  symptomatic treatment with NSAIDs and heat or cold as preferred for menstrual cramps.  Encourage use of contraceptives. Safe sex reinforced, discussed Depo and IUD.  Follow up in Health Dept for contraception planning     Medication List    ASK your doctor about these medications  ibuprofen 600 MG tablet  Commonly known as:  ADVIL,MOTRIN  Take 1 tablet (600 mg total) by mouth every 6 (six) hours.     prenatal multivitamin Tabs tablet  Take 1 tablet by mouth daily at 12 noon.       Encouraged to return here or to other Urgent Care/ED if she develops worsening of symptoms, increase in pain, fever, or other concerning symptoms.   Wynelle Bourgeois CNM, MSN Certified Nurse-Midwife 09/29/2015 4:58 PM

## 2015-09-29 NOTE — Discharge Instructions (Signed)
Abnormal Uterine Bleeding °Abnormal uterine bleeding can affect women at various stages in life, including teenagers, women in their reproductive years, pregnant women, and women who have reached menopause. Several kinds of uterine bleeding are considered abnormal, including: °· Bleeding or spotting between periods.   °· Bleeding after sexual intercourse.   °· Bleeding that is heavier or more than normal.   °· Periods that last longer than usual. °· Bleeding after menopause.   °Many cases of abnormal uterine bleeding are minor and simple to treat, while others are more serious. Any type of abnormal bleeding should be evaluated by your health care provider. Treatment will depend on the cause of the bleeding. °HOME CARE INSTRUCTIONS °Monitor your condition for any changes. The following actions may help to alleviate any discomfort you are experiencing: °· Avoid the use of tampons and douches as directed by your health care provider. °· Change your pads frequently. °You should get regular pelvic exams and Pap tests. Keep all follow-up appointments for diagnostic tests as directed by your health care provider.  °SEEK MEDICAL CARE IF:  °· Your bleeding lasts more than 1 week.   °· You feel dizzy at times.   °SEEK IMMEDIATE MEDICAL CARE IF:  °· You pass out.   °· You are changing pads every 15 to 30 minutes.   °· You have abdominal pain. °· You have a fever.   °· You become sweaty or weak.   °· You are passing large blood clots from the vagina.   °· You start to feel nauseous and vomit. °MAKE SURE YOU:  °· Understand these instructions. °· Will watch your condition. °· Will get help right away if you are not doing well or get worse. °  °This information is not intended to replace advice given to you by your health care provider. Make sure you discuss any questions you have with your health care provider. °  °Document Released: 02/26/2005 Document Revised: 03/03/2013 Document Reviewed: 09/25/2012 °Elsevier Interactive  Patient Education ©2016 [employer] °Bacterial Vaginosis °Bacterial vaginosis is a vaginal infection that occurs when the normal balance of bacteria in the vagina is disrupted. It results from an overgrowth of certain bacteria. This is the most common vaginal infection in women of childbearing age. Treatment is important to prevent complications, especially in pregnant women, as it can cause a premature delivery. °CAUSES  °Bacterial vaginosis is caused by an increase in harmful bacteria that are normally present in smaller amounts in the vagina. Several different kinds of bacteria can cause bacterial vaginosis. However, the reason that the condition develops is not fully understood. °RISK FACTORS °Certain activities or behaviors can put you at an increased risk of developing bacterial vaginosis, including: °· Having a new sex partner or multiple sex partners. °· Douching. °· Using an intrauterine device (IUD) for contraception. °Women do not get bacterial vaginosis from toilet seats, bedding, swimming pools, or contact with objects around them. °SIGNS AND SYMPTOMS  °Some women with bacterial vaginosis have no signs or symptoms. Common symptoms include: °· Grey vaginal discharge. °· A fishlike odor with discharge, especially after sexual intercourse. °· Itching or burning of the vagina and vulva. °· Burning or pain with urination. °DIAGNOSIS  °Your health care provider will take a medical history and examine the vagina for signs of bacterial vaginosis. A sample of vaginal fluid may be taken. Your health care provider will look at this sample under a microscope to check for bacteria and abnormal cells. A vaginal pH test may also be done.  °TREATMENT  °Bacterial vaginosis may be treated   with antibiotic medicines. These may be given in the form of a pill or a vaginal cream. A second round of antibiotics may be prescribed if the condition comes back after treatment. Because bacterial vaginosis increases your risk for  sexually transmitted diseases, getting treated can help reduce your risk for chlamydia, gonorrhea, HIV, and herpes. °HOME CARE INSTRUCTIONS  °· Only take over-the-counter or prescription medicines as directed by your health care provider. °· If antibiotic medicine was prescribed, take it as directed. Make sure you finish it even if you start to feel better. °· Tell all sexual partners that you have a vaginal infection. They should see their health care provider and be treated if they have problems, such as a mild rash or itching. °· During treatment, it is important that you follow these instructions: °¨ Avoid sexual activity or use condoms correctly. °¨ Do not douche. °¨ Avoid alcohol as directed by your health care provider. °¨ Avoid breastfeeding as directed by your health care provider. °SEEK MEDICAL CARE IF:  °· Your symptoms are not improving after 3 days of treatment. °· You have increased discharge or pain. °· You have a fever. °MAKE SURE YOU:  °· Understand these instructions. °· Will watch your condition. °· Will get help right away if you are not doing well or get worse. °FOR MORE INFORMATION  °Centers for Disease Control and Prevention, Division of STD Prevention: www.cdc.gov/std °American Sexual Health Association (ASHA): www.ashastd.org  °  °This information is not intended to replace advice given to you by your health care provider. Make sure you discuss any questions you have with your health care provider. °  °Document Released: 02/26/2005 Document Revised: 03/19/2014 Document Reviewed: 10/08/2012 °Elsevier Interactive Patient Education ©2016 [employer] Inc. ° °

## 2015-09-29 NOTE — MAU Note (Signed)
Pt reports she has not had period since  May. Started having vaginal bleeding last night. [facility] and [facility] told her to come here.Did not get evaluated there because she left,

## 2015-09-30 LAB — HIV ANTIBODY (ROUTINE TESTING W REFLEX): HIV SCREEN 4TH GENERATION: NONREACTIVE

## 2015-09-30 LAB — GC/CHLAMYDIA PROBE AMP (~~LOC~~) NOT AT ARMC
CHLAMYDIA, DNA PROBE: NEGATIVE
NEISSERIA GONORRHEA: NEGATIVE

## 2015-11-29 ENCOUNTER — Encounter (HOSPITAL_COMMUNITY): Payer: Self-pay

## 2015-11-29 ENCOUNTER — Emergency Department (HOSPITAL_COMMUNITY)
Admission: EM | Admit: 2015-11-29 | Discharge: 2015-11-29 | Disposition: A | Discharge status: Home / Self Care | Payer: Medicaid Other | Attending: Emergency Medicine | Admitting: Emergency Medicine

## 2015-11-29 DIAGNOSIS — J020 Streptococcal pharyngitis: Secondary | ICD-10-CM | POA: Diagnosis not present

## 2015-11-29 DIAGNOSIS — F17200 Nicotine dependence, unspecified, uncomplicated: Secondary | ICD-10-CM | POA: Insufficient documentation

## 2015-11-29 DIAGNOSIS — J029 Acute pharyngitis, unspecified: Secondary | ICD-10-CM | POA: Diagnosis present

## 2015-11-29 LAB — RAPID STREP SCREEN (MED CTR MEBANE ONLY): STREPTOCOCCUS, GROUP A SCREEN (DIRECT): POSITIVE — AB

## 2015-11-29 MED ORDER — IBUPROFEN 400 MG PO TABS
800.0000 mg | ORAL_TABLET | Freq: Once | ORAL | Status: DC
  Filled 2015-11-29: qty 2

## 2015-11-29 MED ORDER — PENICILLIN G BENZATHINE & PROC 1200000 UNIT/2ML IM SUSP
1.2000 10*6.[IU] | Freq: Once | INTRAMUSCULAR | Status: DC
  Filled 2015-11-29: qty 2

## 2015-11-29 MED ORDER — DEXAMETHASONE SODIUM PHOSPHATE 10 MG/ML IJ SOLN
10.0000 mg | Freq: Once | INTRAMUSCULAR | Status: AC
  Administered 2015-11-29: 10 mg via INTRAMUSCULAR
  Filled 2015-11-29: qty 1

## 2015-11-29 MED ORDER — PENICILLIN G BENZATHINE 1200000 UNIT/2ML IM SUSP
1.2000 10*6.[IU] | Freq: Once | INTRAMUSCULAR | Status: AC
  Administered 2015-11-29: 1.2 10*6.[IU] via INTRAMUSCULAR

## 2015-11-29 MED ORDER — IBUPROFEN 600 MG PO TABS: 600.0000 mg | ORAL_TABLET | Freq: Four times a day (QID) | ORAL | 0 refills | Status: DC | PRN

## 2015-11-29 MED ORDER — IBUPROFEN 100 MG/5ML PO SUSP
800.0000 mg | Freq: Once | ORAL | Status: AC
  Administered 2015-11-29: 800 mg via ORAL

## 2015-11-29 NOTE — ED Triage Notes (Signed)
Pt states that for the past week she has had a sore throat, and has been taking ABT that belong to her mother. Pt states that has R sided neck pain and ear pain, and hurts to swallow.

## 2015-11-29 NOTE — Discharge Instructions (Addendum)
Read the information below.  You have strep pharyngitis. You were given antibiotics and steroids in the ED.  It is very important that you stay well hydrated. Drink plenty of fluids. You can use OTC throat lozenges for relief.  I have prescribed ibuprofen for pain relief.  Be sure to follow up with primary care provider if symptoms worsen.  You may return to the Emergency Department at any time for worsening condition or any new symptoms that concern you. Return to ED if you are unable to open you mouth, have trouble swallowing, or have difficulty breathing.

## 2015-11-29 NOTE — ED Provider Notes (Signed)
[facility]-EMERGENCY DEPT Provider Note   CSN: [id number] Arrival date & time: 11/29/15  2000   By signing my name below, I, Terri SalisburyJoshua Herrera, attest that this documentation has been prepared under the direction and in the presence of non-physician practitioner, Arvilla MeresAshley Ivette Castronova, PA-C. Electronically Signed: Nelwyn SalisburyJoshua Herrera, Scribe. 11/29/2015. 9:45 Pm.   History   Chief Complaint Chief Complaint  Patient presents with  . Sore Throat  . Otalgia   The history is provided by the patient. No language interpreter was used.    HPI Comments:  Terri Herrera is a 25 y.o. female who presents to the Emergency Department complaining of sudden-onset worsening sore throat beginning two weeks ago. Pt notes that she has tried ibuprofen with mild relief of symptoms. She endorses associated rhinorrhea, sinus congestion, painful swallowing, swollen lymph nodes, neck pain, headache, right ear pain, and fever. She denies trouble swallowing, cough, shortness of breath. She is able to drink liquids and managing oral secretions.   Past Medical History:  Diagnosis Date  . Medical history non-contributory   . No pertinent past medical history     Patient Active Problem List   Diagnosis Date Noted  . Labor and delivery, indication for care 05/24/2014  . Spontaneous vaginal delivery 05/24/2014  . Postpartum care following vaginal delivery 05/24/2014  . [redacted] weeks gestation of pregnancy   . Encounter for fetal anatomic survey     History reviewed. No pertinent surgical history.  OB History    Gravida Para Term Preterm AB Living   4 1 1   3 1    SAB TAB Ectopic Multiple Live Births   3     0 1      Obstetric Comments   Admitted ETOH daily and tobacco use during pregnancy       Home Medications    Prior to Admission medications   Medication Sig Start Date End Date Taking? Authorizing Provider  ibuprofen (ADVIL,MOTRIN) 600 MG tablet Take 1 tablet (600 mg total) by mouth every 6 (six) hours as needed for moderate  pain. 11/29/15   Lona KettleAshley Laurel Dequavius Kuhner, PA-C    Family History Family History  Problem Relation Age of Onset  . Cancer Paternal Grandmother   . Asthma Mother   . Diabetes Mother   . Alcohol abuse Mother   . Cancer Father   . Cancer Paternal Uncle     Social History Social History  Substance Use Topics  . Smoking status: Current Every Day Smoker    Packs/day: 0.25  . Smokeless tobacco: Never Used  . Alcohol use 1.2 oz/week    2 Cans of beer per week     Comment: stopped with pregnancy     Allergies   Review of patient's allergies indicates no known allergies.   Review of Systems Review of Systems  Constitutional: Positive for fever.  HENT: Positive for congestion, ear pain, rhinorrhea and sore throat. Negative for trouble swallowing.   Eyes: Negative for discharge.  Respiratory: Negative for cough and shortness of breath.   Musculoskeletal: Positive for neck pain.  Neurological: Positive for headaches.     Physical Exam Updated Vital Signs BP 115/79   Pulse 103   Temp 101.2 F (38.4 C)   Resp 18   Ht 5\' 3"  (1.6 m)   Wt 114 lb (51.7 kg)   LMP 10/16/2015   SpO2 100%   BMI 20.19 kg/m   Physical Exam  Constitutional: She is oriented to person, place, and time. She appears well-developed and  well-nourished. No distress.  HENT:  Head: Normocephalic and atraumatic.  Right Ear: Tympanic membrane, external ear and ear canal normal.  Left Ear: Tympanic membrane, external ear and ear canal normal.  Nose: Nose normal.  Mouth/Throat: Uvula is midline and mucous membranes are normal. No trismus in the jaw. No uvula swelling. Posterior oropharyngeal erythema present. Tonsillar exudate.  No trismus. Posterior oropharynx erythema. Tonsillary hypertrophy with exudate, R>L. Uvula midline. Pt managing oral secretions.   Eyes: Conjunctivae are normal. Right eye exhibits no discharge. Left eye exhibits no discharge. No scleral icterus.  Neck: Normal range of motion and  phonation normal. No neck rigidity. Normal range of motion present.  No nuchal rigidity.   Cardiovascular: Regular rhythm and intact distal pulses.   Pulmonary/Chest: Effort normal. No stridor. No respiratory distress.  Abdominal: She exhibits no distension.  Lymphadenopathy:    She has cervical adenopathy.       Right cervical: Superficial cervical adenopathy present.  Neurological: She is alert and oriented to person, place, and time.  Skin: Skin is warm and dry. She is not diaphoretic.  Psychiatric: She has a normal mood and affect. Her behavior is normal.  Nursing note and vitals reviewed.    ED Treatments / Results  DIAGNOSTIC STUDIES:  Oxygen Saturation is 100% on RA, normal by my interpretation.    COORDINATION OF CARE:  10:18 PM Discussed treatment plan with pt at bedside which included abx and pt agreed to plan.  Labs (all labs ordered are listed, but only abnormal results are displayed) Labs Reviewed  RAPID STREP SCREEN (NOT AT [facility]) - Abnormal; Notable for the following:       Result Value   Streptococcus, Group A Screen (Direct) POSITIVE (*)    All other components within normal limits    EKG  EKG Interpretation None       Radiology No results found.  Procedures Procedures (including critical care time)  Medications Ordered in ED Medications  ibuprofen (ADVIL,MOTRIN) 100 MG/5ML suspension 800 mg (800 mg Oral Given 11/29/15 2133)  dexamethasone (DECADRON) injection 10 mg (10 mg Intramuscular Given 11/29/15 2234)  penicillin g benzathine (BICILLIN LA) 1200000 UNIT/2ML injection 1.2 Million Units (1.2 Million Units Intramuscular Given 11/29/15 2235)    Vitals:   11/29/15 2014 11/29/15 2015 11/29/15 2219  BP: 115/79  106/68  Pulse: 103  106  Resp: 18  18  Temp: 101.2 F (38.4 C)  98.7 F (37.1 C)  TempSrc:   Oral  SpO2: 100%  98%  Weight:  51.7 kg   Height:  5\' 3"  (1.6 m)     Initial Impression / Assessment and Plan / ED Course  I have reviewed  the triage vital signs and the nursing notes.  Pertinent labs & imaging results that were available during my care of the patient were reviewed by me and considered in my medical decision making (see chart for details).  Clinical Course    Patient presents to ED with sore throat. Patient is febrile and non-toxic appearing in NAD. Vital signs remarkable for tachycardia otherwise stable. Pt sleeping on initial entry into room. Posterior oropharynx erythema, tonsillar hypertrophy with exudate. Cervical lymphadenopathy. No nuchal rigidity. No trismus. Uvula midline. Pt rapid strep test positive. Pt is tolerating secretions. Presentation not concerning for peritonsillar abscess or spread of infection to deep spaces of the throat; patent airway. Treated in the Ed with steroids, NSAIDs, and PCN IM.  Discussed importance of water rehydration. Pt able to drink water in ED without  difficulty with intact air way. Recommended PCP follow up. Return precautions given. Pt voiced understanding and is agreeable.     Final Clinical Impressions(s) / ED Diagnoses   Final diagnoses:  Strep pharyngitis    New Prescriptions Discharge Medication List as of 11/29/2015 10:29 PM    I personally performed the services described in this documentation, which was scribed in my presence. The recorded information has been reviewed and is accurate.     Lona Kettle, PA-C 12/01/15 1730    Alvira Monday, MD 12/08/15 2258

## 2016-03-06 ENCOUNTER — Encounter (HOSPITAL_COMMUNITY): Payer: Self-pay | Admitting: *Deleted

## 2016-03-06 DIAGNOSIS — Z5321 Procedure and treatment not carried out due to patient leaving prior to being seen by health care provider: Secondary | ICD-10-CM | POA: Diagnosis not present

## 2016-03-06 DIAGNOSIS — F17200 Nicotine dependence, unspecified, uncomplicated: Secondary | ICD-10-CM | POA: Insufficient documentation

## 2016-03-06 DIAGNOSIS — R51 Headache: Secondary | ICD-10-CM | POA: Diagnosis not present

## 2016-03-06 NOTE — ED Triage Notes (Signed)
The pt has a cold with congestion sinus runny nose for several days   She is here with her daughter that is being seen in peds at present.  Headache no nv or diarrhea  lmp last month

## 2016-03-06 NOTE — ED Notes (Addendum)
Pt is not in lobby. Pt is in Peds room with child. Could not update vitals.

## 2016-03-07 ENCOUNTER — Emergency Department (HOSPITAL_COMMUNITY)
Admission: EM | Admit: 2016-03-07 | Discharge: 2016-03-07 | Disposition: A | Discharge status: Home / Self Care | Payer: Medicaid Other | Attending: Dermatology | Admitting: Dermatology

## 2016-05-29 ENCOUNTER — Ambulatory Visit: Payer: Medicaid Other

## 2016-06-04 ENCOUNTER — Encounter: Payer: Self-pay | Admitting: Internal Medicine

## 2016-06-04 ENCOUNTER — Ambulatory Visit (INDEPENDENT_AMBULATORY_CARE_PROVIDER_SITE_OTHER): Payer: Medicaid Other | Admitting: Internal Medicine

## 2016-06-04 DIAGNOSIS — M79671 Pain in right foot: Secondary | ICD-10-CM | POA: Diagnosis present

## 2016-06-04 DIAGNOSIS — B070 Plantar wart: Secondary | ICD-10-CM | POA: Insufficient documentation

## 2016-06-04 NOTE — Patient Instructions (Signed)
Ms. Terri Herrera,  It was a pleasure to see you today. I have ordered an X-ray of your right foot to evaluate for any retained glass. Please go upstairs to the first floor radiology department after your appointment today to get this done. I will call you with the results. I have also referred you to podiatry for your plantar's warts. They will call you to schedule an appointment. If you have any questions or concerns, call our clinic at [phone] or after hours call [phone] and ask for the internal medicine resident on call. Thank you!  - Dr. Antony ContrasGuilloud

## 2016-06-04 NOTE — Assessment & Plan Note (Addendum)
Patient reports stepping on glass back in October. She was wearing flip-flops that folded back on themselves when she was walking outside of a store and had class puncture the bottom of her foot. Patient says she removed the glass at home herself an did not seek medical attention at that time. Since the incident she has had persistent pain over the area. On exam there is a well healed scar over the later aspect of her right midfoot with some swelling and hyperkeratosis over the plantar surface. Area is very tender to palpation. Will order plain films of her right foot to evaluate for retained glass and refer to podiatry. -- f/u right foot plain films -- Podiatry referral

## 2016-06-04 NOTE — Progress Notes (Signed)
Internal Medicine Clinic Attending  I saw and evaluated the patient.  I personally confirmed the key portions of the history and exam documented by Dr. Guilloud and I reviewed pertinent patient test results.  The assessment, diagnosis, and plan were formulated together and I agree with the documentation in the resident's note.  

## 2016-06-04 NOTE — Assessment & Plan Note (Signed)
Patient has a plantar wart on the plantar surface of her right forefoot. She endorses pain with ambulation.  -- Refer to podiatry for treatment

## 2016-06-04 NOTE — Progress Notes (Signed)
   CC: foot pain  HPI:  Terri Herrera is a 26 y.o. female with no past medical history here for foot pain. For the details of today's visit, please refer to the assessment and plan.  Past Medical History:  Diagnosis Date  . Medical history non-contributory   . No pertinent past medical history     Review of Systems:  All pertinents listed in HPI, otherwise negative  Physical Exam:  Vitals:   06/04/16 0847  BP: 109/70  Pulse: 87  Temp: 98.4 F (36.9 C)  TempSrc: Oral  SpO2: 100%  Weight: 121 lb 6.4 oz (55.1 kg)  Height: 5\' 3"  (1.6 m)    Constitutional: NAD, appears comfortable Cardiovascular: RRR Pulmonary/Chest: CTAB Extremities: Right foot with well healed scar over the lateral aspect of her right midfoot. Plantar surface is mildly swollen and very tender to palpation. Overlying skin is hyperkeratotic. Plantar wart noted on the plantar aspect of her right forefoot.  Neurological: A&Ox3, CN II - XII grossly intact.    Assessment & Plan:   See Encounters Tab for problem based charting.  Patient seen with Dr. Cleda DaubE. Hoffman

## 2016-06-20 ENCOUNTER — Ambulatory Visit: Payer: Medicaid Other | Admitting: Podiatry

## 2016-07-06 ENCOUNTER — Ambulatory Visit: Payer: Medicaid Other | Admitting: Podiatry

## 2016-07-09 NOTE — Addendum Note (Signed)
Addended by: Neomia Dear on: 07/09/2016 07:21 PM   Modules accepted: Orders

## 2016-08-07 ENCOUNTER — Encounter: Payer: Self-pay | Admitting: Internal Medicine

## 2016-08-22 ENCOUNTER — Ambulatory Visit (HOSPITAL_COMMUNITY)
Admission: RE | Admit: 2016-08-22 | Discharge: 2016-08-22 | Disposition: A | Discharge status: Home / Self Care | Payer: Medicaid Other | Source: Ambulatory Visit | Attending: Internal Medicine | Admitting: Internal Medicine

## 2016-08-22 DIAGNOSIS — M79671 Pain in right foot: Secondary | ICD-10-CM | POA: Insufficient documentation

## 2016-09-25 ENCOUNTER — Emergency Department (HOSPITAL_COMMUNITY): Payer: Medicaid Other

## 2016-09-25 ENCOUNTER — Encounter (HOSPITAL_COMMUNITY): Payer: Self-pay | Admitting: Emergency Medicine

## 2016-09-25 ENCOUNTER — Emergency Department (HOSPITAL_COMMUNITY)
Admission: EM | Admit: 2016-09-25 | Discharge: 2016-09-26 | Disposition: A | Discharge status: Home / Self Care | Payer: Medicaid Other | Attending: Emergency Medicine | Admitting: Emergency Medicine

## 2016-09-25 DIAGNOSIS — R490 Dysphonia: Secondary | ICD-10-CM | POA: Diagnosis not present

## 2016-09-25 DIAGNOSIS — F17210 Nicotine dependence, cigarettes, uncomplicated: Secondary | ICD-10-CM | POA: Diagnosis not present

## 2016-09-25 DIAGNOSIS — J36 Peritonsillar abscess: Secondary | ICD-10-CM

## 2016-09-25 DIAGNOSIS — R509 Fever, unspecified: Secondary | ICD-10-CM | POA: Insufficient documentation

## 2016-09-25 DIAGNOSIS — J029 Acute pharyngitis, unspecified: Secondary | ICD-10-CM | POA: Diagnosis present

## 2016-09-25 DIAGNOSIS — M542 Cervicalgia: Secondary | ICD-10-CM | POA: Insufficient documentation

## 2016-09-25 LAB — RAPID STREP SCREEN (MED CTR MEBANE ONLY): Streptococcus, Group A Screen (Direct): NEGATIVE

## 2016-09-25 LAB — I-STAT BETA HCG BLOOD, ED (MC, WL, AP ONLY)

## 2016-09-25 MED ORDER — SODIUM CHLORIDE 0.9 % IV BOLUS (SEPSIS)
1000.0000 mL | Freq: Once | INTRAVENOUS | Status: AC
Start: 2016-09-25 — End: 2016-09-26
  Administered 2016-09-25: 1000 mL via INTRAVENOUS

## 2016-09-25 MED ORDER — CLINDAMYCIN PHOSPHATE 600 MG/50ML IV SOLN
600.0000 mg | Freq: Once | INTRAVENOUS | Status: AC
  Administered 2016-09-25: 600 mg via INTRAVENOUS
  Filled 2016-09-25: qty 50

## 2016-09-25 MED ORDER — ACETAMINOPHEN 160 MG/5ML PO SOLN
500.0000 mg | Freq: Once | ORAL | Status: AC
  Administered 2016-09-25: 500 mg via ORAL
  Filled 2016-09-25: qty 20.3

## 2016-09-25 MED ORDER — SODIUM CHLORIDE 0.9 % IV BOLUS (SEPSIS)
1000.0000 mL | Freq: Once | INTRAVENOUS | Status: AC
  Administered 2016-09-25: 1000 mL via INTRAVENOUS

## 2016-09-25 MED ORDER — DEXAMETHASONE SODIUM PHOSPHATE 4 MG/ML IJ SOLN
8.0000 mg | Freq: Once | INTRAMUSCULAR | Status: AC
  Administered 2016-09-25: 8 mg via INTRAVENOUS
  Filled 2016-09-25: qty 2

## 2016-09-25 MED ORDER — IOPAMIDOL (ISOVUE-300) INJECTION 61%
INTRAVENOUS | Status: AC
  Administered 2016-09-25: 75 mL
  Filled 2016-09-25: qty 75

## 2016-09-25 MED ORDER — HYDROCODONE-ACETAMINOPHEN 7.5-325 MG/15ML PO SOLN
10.0000 mL | Freq: Once | ORAL | Status: AC
  Administered 2016-09-25: 10 mL via ORAL
  Filled 2016-09-25: qty 15

## 2016-09-25 NOTE — ED Notes (Signed)
Pt taken to CT.

## 2016-09-25 NOTE — ED Triage Notes (Signed)
Pt has had throat pain since last Thursday. Pt states pain goes into her ears. Pt hoarse from the pain. Pt states it is very painful to swallow. Pt states she feels it very swollen. No resp distress.

## 2016-09-25 NOTE — ED Provider Notes (Signed)
[facility] Provider Note   CSN: [id number] Arrival date & time: 09/25/16  1802  By signing my name below, I, Rosario AdieWilliam Andrew Hiatt, attest that this documentation has been prepared under the direction and in the presence of Sharen Hecklaudia Lanice Folden, PA-C.  Electronically Signed: Rosario AdieWilliam Andrew Hiatt, ED Scribe. 09/25/16. 7:39 PM.  History   Chief Complaint Chief Complaint  Patient presents with  . Sore Throat   The history is provided by the patient. No language interpreter was used.    HPI Comments: Terri Herrera is a 26 y.o. female who presents to the Emergency Department complaining of persistent, worsening sore throat beginning five days ago. She notes associated dysphonia, drooling, subjective fever, trismus, decreased PO intake, pain with neck movement, and rhinorrhea. She reports that her pain also extends into the anterior neck and into her bilateral ears. She feels her throat is closing up. She attempted taking two different antibiotics at home which were left over from previous infections which did not relieve her symptoms. Unsure of name of antibiotic. Her pain is significantly worse with swallowing and opening her mouth. She denies chest pain, or any other associated symptoms.   Past Medical History:  Diagnosis Date  . Medical history non-contributory   . No pertinent past medical history    Patient Active Problem List   Diagnosis Date Noted  . Foot pain, right 06/04/2016  . Plantar wart of right foot 06/04/2016   History reviewed. No pertinent surgical history.  OB History    Gravida Para Term Preterm AB Living   4 1 1   3 1    SAB TAB Ectopic Multiple Live Births   3     0 1      Obstetric Comments   Admitted ETOH daily and tobacco use during pregnancy     Home Medications    Prior to Admission medications   Medication Sig Start Date End Date Taking? Authorizing Provider  ibuprofen (ADVIL,MOTRIN) 600 MG tablet Take 1 tablet (600 mg total) by mouth every 6  (six) hours as needed for moderate pain. 11/29/15   Deborha PaymentMeyer, Ashley L, PA-C   Family History Family History  Problem Relation Age of Onset  . Cancer Paternal Grandmother   . Asthma Mother   . Diabetes Mother   . Alcohol abuse Mother   . Cancer Father   . Cancer Paternal Uncle    Social History Social History  Substance Use Topics  . Smoking status: Current Every Day Smoker    Packs/day: 0.25  . Smokeless tobacco: Never Used  . Alcohol use 1.2 oz/week    2 Cans of beer per week     Comment: stopped with pregnancy   Allergies   Patient has no known allergies.  Review of Systems Review of Systems  Constitutional: Positive for fever.  HENT: Positive for drooling, rhinorrhea, sore throat and voice change.   Musculoskeletal: Positive for neck pain.  All other systems reviewed and are negative.  Physical Exam Updated Vital Signs BP 124/83 (BP Location: Right Arm)   Pulse 97   Temp 99.7 F (37.6 C) (Oral)   Resp 16   Ht 5\' 4"  (1.626 m)   Wt 52.2 kg (115 lb)   LMP 09/23/2016   SpO2 100%   BMI 19.74 kg/m   Physical Exam  Constitutional: She is oriented to person, place, and time. She appears well-developed and well-nourished. No distress.  NAD.  HENT:  Head: Normocephalic and atraumatic.  Right Ear: Tympanic membrane and  external ear normal.  Left Ear: Tympanic membrane and external ear normal.  Nose: Mucosal edema present.  Mouth/Throat: Oropharynx is clear and moist and mucous membranes are normal. There is trismus in the jaw. No posterior oropharyngeal edema or posterior oropharyngeal erythema. Tonsils are 1+ on the right. Tonsils are 3+ on the left.  Moderate mucosal edema.  Oropharynx and tonsils erythematous and edematous Tonsillar hypertrophy L>R +Trismus Some pooling of oral secretions.  + Hot potato voice.  Full neck AROM with reported pain. No rigidity. No visualized abscess, exudates or petechiae, but tonsillar asymmetry (L>R).  No sublingual edema or  tenderness.  Soft palate flat without tenderness.  Maxilla and mandible nontender. Mastoids without edema, erythema or tenderness.    Eyes: Conjunctivae and EOM are normal. No scleral icterus.  Neck: Normal range of motion. Neck supple.  Bilateral submandibular and tonsillar LAD, R>L. These are tender.   Cardiovascular: Normal rate, regular rhythm and normal heart sounds.   No murmur heard. Pulmonary/Chest: Effort normal and breath sounds normal. She has no wheezes.  Musculoskeletal: Normal range of motion. She exhibits no deformity.  Lymphadenopathy:    She has cervical adenopathy.  Neurological: She is alert and oriented to person, place, and time.  Skin: Skin is warm and dry. Capillary refill takes less than 2 seconds.  Psychiatric: She has a normal mood and affect. Her behavior is normal. Judgment and thought content normal.  Nursing note and vitals reviewed.  ED Treatments / Results  DIAGNOSTIC STUDIES: Oxygen Saturation is 100% on RA, normal by my interpretation.   COORDINATION OF CARE: 7:37 PM-Discussed next steps with pt. Pt verbalized understanding and is agreeable with the plan.   Labs (all labs ordered are listed, but only abnormal results are displayed) Labs Reviewed  RAPID STREP SCREEN (NOT AT [facility])  CULTURE, GROUP A STREP ([facility])  I-STAT CHEM 8, ED  I-STAT BETA HCG BLOOD, ED ([facility], [facility], [facility] ONLY)   EKG  EKG Interpretation None       Radiology Ct Soft Tissue Neck W Contrast  Result Date: 09/25/2016 CLINICAL DATA:  26 y/o F; trismus, asymmetric tonsillar hypertrophy. EXAM: CT NECK WITH CONTRAST TECHNIQUE: Multidetector CT imaging of the neck was performed using the standard protocol following the bolus administration of intravenous contrast. CONTRAST:  75mL ISOVUE-300 IOPAMIDOL (ISOVUE-300) INJECTION 61% COMPARISON:  05/29/2013 CT cervical spine.  04/25/2011 CT neck. FINDINGS: Pharynx and larynx: Left palatine tonsillar fluid collection measuring 14 x 12 x 17 mm  (AP x ML x CC series 3: Image 33, 6:51). Right tonsillar fluid collection measuring 7 x 6 x 9 mm (3:35, 6:55). Adenoid and palatine tonsillar enlargement. Fat stranding within the left-greater-than-right parapharyngeal spaces likely representing reactive edema. Salivary glands: No inflammation, mass, or stone. Thyroid: Normal. Lymph nodes: Prominent upper cervical lymph nodes are likely reactive. Vascular: Negative. Limited intracranial: Negative. Visualized orbits: Negative. Mastoids and visualized paranasal sinuses: Clear. Skeleton: No acute or aggressive process. Upper chest: Negative. Other: None. IMPRESSION: Bilateral palatine tonsil fluid collections likely representing tonsillar abscesses measuring up to 17 mm on the left and 9 mm on the right. Mild reactive edema in the left-greater-than-right parapharyngeal spaces. Electronically Signed   By: Mitzi Hansen M.D.   On: 09/25/2016 21:17    Procedures Procedures   Medications Ordered in ED Medications  sodium chloride 0.9 % bolus 1,000 mL (not administered)  sodium chloride 0.9 % bolus 1,000 mL (not administered)  clindamycin (CLEOCIN) IVPB 600 mg (not administered)  dexamethasone (DECADRON) injection 8 mg (  8 mg Intravenous Given 09/25/16 2004)  HYDROcodone-acetaminophen (HYCET) 7.5-325 mg/15 ml solution 10 mL (10 mLs Oral Given 09/25/16 2012)  acetaminophen (TYLENOL) solution 500 mg (500 mg Oral Given 09/25/16 2004)  iopamidol (ISOVUE-300) 61 % injection (75 mLs  Contrast Given 09/25/16 2046)    Initial Impression / Assessment and Plan / ED Course  I have reviewed the triage vital signs and the nursing notes.  Pertinent labs & imaging results that were available during my care of the patient were reviewed by me and considered in my medical decision making (see chart for details).  Clinical Course as of Sep 25 2217  Tue Sep 25, 2016  2217 IMPRESSION: Bilateral palatine tonsil fluid collections likely representing tonsillar  abscesses measuring up to 17 mm on the left and 9 mm on the right. Mild reactive edema in the left-greater-than-right parapharyngeal spaces. CT Soft Tissue Neck W Contrast [CG]    Clinical Course User Index [CG] Liberty Handy, PA-C   26 year old female presents to the ED with pharyngitis 5 days associated with subjective fever, trismus, pain with neck movement, hot potato voice, pooling of oral secretions. Exam remarkable for tonsillar hypertrophy and asymmetry larger on the left, left tonsil is touching the uvula which is mildly edematous. Patient has trismus and mild pooling of oral secretions, no neck rigidity but neck pain with movement. I did not visualize obvious PTA or exudates. Exam concerning for deep neck abscess vs peritonsillar abscess. Given exam will get CT scan, provide analgesics, steroids and reassess.  CT scan shows bilateral, small, tonsil abscesses. ENT was consultative who advised 2 L IV fluids, clindamycin IV and discharge with oral clindamycin, high dose NSAIDs, push fluids. Discussed plan with patient who is agreeable. We'll start IV fluids, antibiotics. Plan to discharge.  Final Clinical Impressions(s) / ED Diagnoses   Final diagnoses:  Sore throat   New Prescriptions New Prescriptions   No medications on file   I personally performed the services described in this documentation, which was scribed in my presence. The recorded information has been reviewed and is accurate.     Liberty Handy, PA-C 09/26/16 0019    Charlynne Pander, MD 09/27/16 Jerene Bears

## 2016-09-26 LAB — I-STAT CHEM 8, ED
BUN: 10 mg/dL (ref 6–20)
CREATININE: 0.6 mg/dL (ref 0.44–1.00)
Calcium, Ion: 1.14 mmol/L — ABNORMAL LOW (ref 1.15–1.40)
Chloride: 98 mmol/L — ABNORMAL LOW (ref 101–111)
Glucose, Bld: 91 mg/dL (ref 65–99)
HEMATOCRIT: 41 % (ref 36.0–46.0)
Hemoglobin: 13.9 g/dL (ref 12.0–15.0)
POTASSIUM: 3.5 mmol/L (ref 3.5–5.1)
Sodium: 140 mmol/L (ref 135–145)
TCO2: 28 mmol/L (ref 0–100)

## 2016-09-26 MED ORDER — ACETAMINOPHEN 160 MG/5ML PO LIQD: 480.0000 mg | Freq: Three times a day (TID) | ORAL | 0 refills | Status: AC | PRN

## 2016-09-26 MED ORDER — IBUPROFEN 400 MG PO TABS
600.0000 mg | ORAL_TABLET | Freq: Once | ORAL | Status: AC
  Administered 2016-09-26: 600 mg via ORAL
  Filled 2016-09-26: qty 1

## 2016-09-26 MED ORDER — CLINDAMYCIN HCL 150 MG PO CAPS: 300.0000 mg | ORAL_CAPSULE | Freq: Three times a day (TID) | ORAL | 0 refills | Status: AC

## 2016-09-26 NOTE — Discharge Instructions (Signed)
Your CT scan showed it two peritonsillar abscesses.  We treated this in the emergency Department with steroids, pain medications and antibiotics. Your received IV fluids to help with hydration.   You will be discharged with clindamycin, an antibiotic, that you will take 3 times a day for the next 10 days. If you are unable to swallow the capsules you may open up the capsules and sprinkle the Contents into Applesauce to Help You Take the Medicine and Avoid Pain. Additionally You Can Take Over the Counter Liquid or Suspension Ibuprofen and/or Tylenol Every 8 Hours. You Can Alternate Ibuprofen and Tylenol. It is important that you stay hydrated, drink at least 2 L of water daily to avoid dehydration. Your Symptoms Should Start to Improve within 48-72 Hours of Starting Antibiotics, If Your Symptoms Worsen or Do Not Improve Please Call Terri Herrera (Ear Nose and Throat Specialist) to Be Evaluated in the Office.  Return to the emergency department if you develop difficulty breathing, drooling, neck stiffness or if you notice worsening symptoms before your able to see Terri Herrera.

## 2016-09-27 LAB — CULTURE, GROUP A STREP (THRC)

## 2016-09-28 ENCOUNTER — Telehealth: Payer: Self-pay

## 2016-09-28 NOTE — Telephone Encounter (Signed)
No change in treatment for strep culture from ED 09/26/16 per Mickle PlumbMichele Turner Pharm D

## 2016-12-13 ENCOUNTER — Ambulatory Visit (HOSPITAL_COMMUNITY)
Admission: EM | Admit: 2016-12-13 | Discharge: 2016-12-13 | Disposition: A | Discharge status: Home / Self Care | Payer: Self-pay | Attending: Family Medicine | Admitting: Family Medicine

## 2016-12-13 ENCOUNTER — Encounter (HOSPITAL_COMMUNITY): Payer: Self-pay | Admitting: Emergency Medicine

## 2016-12-13 DIAGNOSIS — N898 Other specified noninflammatory disorders of vagina: Secondary | ICD-10-CM

## 2016-12-13 DIAGNOSIS — Z3202 Encounter for pregnancy test, result negative: Secondary | ICD-10-CM

## 2016-12-13 DIAGNOSIS — Z202 Contact with and (suspected) exposure to infections with a predominantly sexual mode of transmission: Secondary | ICD-10-CM

## 2016-12-13 DIAGNOSIS — B86 Scabies: Secondary | ICD-10-CM

## 2016-12-13 DIAGNOSIS — Z113 Encounter for screening for infections with a predominantly sexual mode of transmission: Secondary | ICD-10-CM

## 2016-12-13 LAB — POCT URINALYSIS DIP (DEVICE)
Bilirubin Urine: NEGATIVE
GLUCOSE, UA: NEGATIVE mg/dL
Hgb urine dipstick: NEGATIVE
Ketones, ur: NEGATIVE mg/dL
Leukocytes, UA: NEGATIVE
Nitrite: NEGATIVE
PROTEIN: NEGATIVE mg/dL
Specific Gravity, Urine: <=1.005 (ref 1.005–1.030)
UROBILINOGEN UA: 0.2 mg/dL (ref 0.0–1.0)
pH: 5.5 (ref 5.0–8.0)

## 2016-12-13 LAB — POCT PREGNANCY, URINE: PREG TEST UR: NEGATIVE

## 2016-12-13 MED ORDER — CEFTRIAXONE SODIUM 250 MG IJ SOLR
INTRAMUSCULAR | Status: AC
  Filled 2016-12-13: qty 250

## 2016-12-13 MED ORDER — AZITHROMYCIN 250 MG PO TABS
1000.0000 mg | ORAL_TABLET | Freq: Once | ORAL | Status: AC
  Administered 2016-12-13: 1000 mg via ORAL

## 2016-12-13 MED ORDER — IVERMECTIN 3 MG PO TABS: 12.0000 mg | ORAL_TABLET | Freq: Once | ORAL | 1 refills | Status: AC

## 2016-12-13 MED ORDER — LIDOCAINE HCL (PF) 1 % IJ SOLN
INTRAMUSCULAR | Status: AC
  Filled 2016-12-13: qty 2

## 2016-12-13 MED ORDER — AZITHROMYCIN 250 MG PO TABS
ORAL_TABLET | ORAL | Status: AC
  Filled 2016-12-13: qty 4

## 2016-12-13 MED ORDER — CEFTRIAXONE SODIUM 250 MG IJ SOLR
250.0000 mg | Freq: Once | INTRAMUSCULAR | Status: AC
  Administered 2016-12-13: 250 mg via INTRAMUSCULAR

## 2016-12-13 NOTE — ED Provider Notes (Signed)
[facility]   [id number] 12/13/16 Arrival Time: 1402   SUBJECTIVE:  Terri Herrera is a 26 y.o. female who presents to the urgent care with complaint of  rash all over her body that she thinks is scabies. She has been staying in an unclean apartment until recently when she moved in with her mother. She feels safe staying with her mother now.  She also reports vaginal d/c, odor and irritation x1 month.  She states she is unsure of who her daughters father may have been with so she is concerned she may have an STD.  Patient says that her partner has been sleeping with a new partner. She's been having some increased discharge   Past Medical History:  Diagnosis Date  . Medical history non-contributory   . No pertinent past medical history    Family History  Problem Relation Age of Onset  . Cancer Paternal Grandmother   . Asthma Mother   . Diabetes Mother   . Alcohol abuse Mother   . Cancer Father   . Cancer Paternal Uncle    Social History   Social History  . Marital status: Single    Spouse name: N/A  . Number of children: N/A  . Years of education: N/A   Occupational History  . Not on file.   Social History Main Topics  . Smoking status: Current Every Day Smoker    Packs/day: 0.25  . Smokeless tobacco: Never Used  . Alcohol use 1.2 oz/week    2 Cans of beer per week     Comment: stopped with pregnancy  . Drug use: No  . Sexual activity: Yes    Birth control/ protection: None   Other Topics Concern  . Not on file   Social History Narrative  . No narrative on file   No outpatient prescriptions have been marked as taking for the 12/13/16 encounter [facility] Encounter).   No Known Allergies    ROS: As per HPI, remainder of ROS negative.   OBJECTIVE:   Vitals:   12/13/16 1512  BP: 103/65  Pulse: (!) 113  Temp: 97.7 F (36.5 C)  TempSrc: Oral  SpO2: 97%     General appearance: alert; no distress Eyes: PERRL; EOMI; conjunctiva  normal HENT: normocephalic; atraumatic; TMs normal, canal normal, external ears normal without trauma; nasal mucosa normal; oral mucosa normal Neck: supple Lungs: clear to auscultation bilaterally Heart: regular rate and rhythm Abdomen: soft, non-tender; bowel sounds normal; no masses or organomegaly; no guarding or rebound tenderness Back: no CVA tenderness Extremities: no cyanosis or edema; symmetrical with no gross deformities Skin: warm and dry; diffuse excoriated small papules on forearms and abdomen along with breasts. Neurologic: normal gait; grossly normal Psychological: alert and cooperative; normal mood and affect      Labs:  Results for orders placed or performed during the hospital encounter of 09/25/16  Rapid strep screen  Result Value Ref Range   Streptococcus, Group A Screen (Direct) NEGATIVE NEGATIVE  Culture, group A strep  Result Value Ref Range   Specimen Description THROAT    Special Requests NONE Reflexed from [id number]    Culture RARE GROUP A STREP (S.PYOGENES) ISOLATED    Report Status 09/27/2016 FINAL   I-Stat Chem 8, ED  Result Value Ref Range   Sodium 140 135 - 145 mmol/L   Potassium 3.5 3.5 - 5.1 mmol/L   Chloride 98 (L) 101 - 111 mmol/L   BUN 10 6 - 20 mg/dL   Creatinine,  Ser 0.60 0.44 - 1.00 mg/dL   Glucose, Bld 91 65 - 99 mg/dL   Calcium, Ion 9.56 (L) 1.15 - 1.40 mmol/L   TCO2 28 0 - 100 mmol/L   Hemoglobin 13.9 12.0 - 15.0 g/dL   HCT 21.3 08.6 - 57.8 %  I-Stat Beta hCG blood, ED ([facility], [facility], [facility] only)  Result Value Ref Range   I-stat hCG, quantitative <5.0 <5 mIU/mL   Comment 3            Labs Reviewed  URINE CYTOLOGY ANCILLARY ONLY    No results found.     ASSESSMENT & PLAN:  1. Scabies   2. STD exposure     Meds ordered this encounter  Medications  . azithromycin (ZITHROMAX) tablet 1,000 mg  . cefTRIAXone (ROCEPHIN) injection 250 mg  . ivermectin (STROMECTOL) 3 MG TABS tablet    Sig: Take 4 tablets (12 mg total) by mouth once.     Dispense:  4 tablet    Refill:  1    Reviewed expectations re: course of current medical issues. Questions answered. Outlined signs and symptoms indicating need for more acute intervention. Patient verbalized understanding. After Visit Summary given.      Elvina Sidle, MD 12/13/16 1546

## 2016-12-13 NOTE — Discharge Instructions (Signed)
Please return if her symptoms haven't cleared in 5 days.

## 2016-12-13 NOTE — ED Triage Notes (Signed)
Pt reports a rash all over her body that she thinks is scabies.   She also reports vaginal d/c, odor and irritation x1 month.  She states she is unsure of who her daughters father may have been with so she is concerned she may have an STD.

## 2017-05-18 ENCOUNTER — Other Ambulatory Visit: Payer: Self-pay

## 2017-05-18 ENCOUNTER — Ambulatory Visit (HOSPITAL_COMMUNITY)
Admission: EM | Admit: 2017-05-18 | Discharge: 2017-05-18 | Disposition: A | Discharge status: Home / Self Care | Payer: Medicaid Other | Attending: Family Medicine | Admitting: Family Medicine

## 2017-05-18 ENCOUNTER — Encounter (HOSPITAL_COMMUNITY): Payer: Self-pay | Admitting: *Deleted

## 2017-05-18 DIAGNOSIS — L03115 Cellulitis of right lower limb: Secondary | ICD-10-CM | POA: Diagnosis not present

## 2017-05-18 DIAGNOSIS — Z3202 Encounter for pregnancy test, result negative: Secondary | ICD-10-CM | POA: Diagnosis not present

## 2017-05-18 DIAGNOSIS — M25561 Pain in right knee: Secondary | ICD-10-CM

## 2017-05-18 LAB — POCT PREGNANCY, URINE: Preg Test, Ur: NEGATIVE

## 2017-05-18 MED ORDER — KETOROLAC TROMETHAMINE 60 MG/2ML IM SOLN
INTRAMUSCULAR | Status: AC
  Filled 2017-05-18: qty 2

## 2017-05-18 MED ORDER — DOXYCYCLINE HYCLATE 100 MG PO TABS: 100.0000 mg | ORAL_TABLET | Freq: Two times a day (BID) | ORAL | 0 refills | Status: DC

## 2017-05-18 MED ORDER — OXYCODONE HCL 5 MG PO TABS: 5.0000 mg | ORAL_TABLET | Freq: Four times a day (QID) | ORAL | 0 refills | Status: DC | PRN

## 2017-05-18 MED ORDER — KETOROLAC TROMETHAMINE 60 MG/2ML IM SOLN
60.0000 mg | Freq: Once | INTRAMUSCULAR | Status: AC
  Administered 2017-05-18: 60 mg via INTRAMUSCULAR

## 2017-05-18 NOTE — Discharge Instructions (Signed)
If you start having fevers, worsening pain, decreased range of motion in knee, go directly to ER.   Heat (pad or rice pillow in microwave) over affected area, 10-15 minutes twice daily.   Ice/cold pack over area for 10-15 min twice daily.  Ibuprofen 400-600 mg (2-3 over the counter strength tabs) every 6 hours as needed for pain.

## 2017-05-18 NOTE — ED Provider Notes (Signed)
  [facility]    CSN: [id number] Arrival date & time: 05/18/17  1543  Musculoskeletal Exam  Patient: Terri Herrera DOB: 10/15/1990  DOS: 05/18/2017  SUBJECTIVE:  Chief Complaint:   Chief Complaint  Patient presents with  . Knee Pain    Terri Herrera is a 27 y.o.  female for evaluation and treatment of R knee pain.   Onset: Today, no inj or change in activity Randomly comes and goes. Location: Ant R knee Character:  stabbing  Progression of issue:  is unchanged Associated symptoms: bruising No catching or locking Treatment: to date has been ice.   Neurovascular symptoms: no  ROS: Musculoskeletal/Extremities: +R knee pain  History reviewed. No pertinent past medical history.  Objective: VITAL SIGNS: BP 121/79   Pulse 87   Temp 98.7 F (37.1 C) (Oral)   Resp 16   LMP 04/16/2017 (Approximate)   SpO2 100%   Breastfeeding? No  Constitutional: Well formed, well developed. No acute distress. Cardiovascular: RRR Thorax & Lungs: CTAB. No accessory muscle use Musculoskeletal: R knee.   Normal active range of motion: yes.   Normal passive range of motion: yes Tenderness to palpation: yes, medially Deformity: no Ecchymosis: no Tests positive: None Tests negative: Varus/valgus, Lachman's, McMurray's, Patellar grind Skin: Pt is darker in complexion, there is excessive warmth, mild hyperpigmentation and extreme TTP over the medial area of knee. I do not appreciate any drainage, fluctuance, excoriation or scaling Neurologic: Normal sensory function. Psychiatric: Normal mood. Age appropriate judgment and insight. Alert & oriented x 3.    Assessment:  Cellulitis of right lower extremity  Plan: Odd presentation. I do not think this is msk. All her pain is located over medial area and there is no significant bony ttp. There is warmth. Doxy 100 mg BID for 10 d, Oxycodone 5 mg q 6 hrs prn. Do not drink alcohol, do any illicit/street drugs, drive or do anything that  requires alertness while on this medicine. Ibuprofen. Trial and error for ice/heat. F/u with pcp prn. If she starts developing fevers, worsening pain or difficulty moving the knee at all, she is to go to the ER. This was emphasized.  The patient voiced understanding and agreement to the plan.    Sharlene DoryWendling, Wilfrido Luedke Paul, [place] 05/18/17 Flossie [id number]

## 2017-05-18 NOTE — ED Triage Notes (Signed)
Denies injury.  Reports waking this AM with right knee pain & noticing "a bruise".  Pt describes spasms.  CMS intact.

## 2017-05-28 ENCOUNTER — Emergency Department (HOSPITAL_COMMUNITY)
Admission: EM | Admit: 2017-05-28 | Discharge: 2017-05-28 | Disposition: A | Discharge status: Home / Self Care | Payer: Medicaid Other | Attending: Emergency Medicine | Admitting: Emergency Medicine

## 2017-05-28 ENCOUNTER — Encounter (HOSPITAL_COMMUNITY): Payer: Self-pay | Admitting: *Deleted

## 2017-05-28 ENCOUNTER — Other Ambulatory Visit: Payer: Self-pay

## 2017-05-28 DIAGNOSIS — N898 Other specified noninflammatory disorders of vagina: Secondary | ICD-10-CM | POA: Insufficient documentation

## 2017-05-28 DIAGNOSIS — F17210 Nicotine dependence, cigarettes, uncomplicated: Secondary | ICD-10-CM | POA: Insufficient documentation

## 2017-05-28 DIAGNOSIS — R51 Headache: Secondary | ICD-10-CM | POA: Diagnosis not present

## 2017-05-28 DIAGNOSIS — J029 Acute pharyngitis, unspecified: Secondary | ICD-10-CM | POA: Insufficient documentation

## 2017-05-28 LAB — COMPREHENSIVE METABOLIC PANEL
ALK PHOS: 21 U/L — AB (ref 38–126)
ALT: 36 U/L (ref 14–54)
ANION GAP: 11 (ref 5–15)
AST: 32 U/L (ref 15–41)
Albumin: 4.1 g/dL (ref 3.5–5.0)
BILIRUBIN TOTAL: 0.7 mg/dL (ref 0.3–1.2)
BUN: 8 mg/dL (ref 6–20)
CALCIUM: 9.5 mg/dL (ref 8.9–10.3)
CO2: 23 mmol/L (ref 22–32)
Chloride: 101 mmol/L (ref 101–111)
Creatinine, Ser: 0.85 mg/dL (ref 0.44–1.00)
GFR calc Af Amer: >60 mL/min (ref >60)
GFR calc non Af Amer: >60 mL/min (ref >60)
GLUCOSE: 107 mg/dL — AB (ref 65–99)
Potassium: 3.8 mmol/L (ref 3.5–5.1)
Sodium: 135 mmol/L (ref 135–145)
TOTAL PROTEIN: 7.9 g/dL (ref 6.5–8.1)

## 2017-05-28 LAB — CBC WITH DIFFERENTIAL/PLATELET
BASOS PCT: 0 %
Basophils Absolute: 0 10*3/uL (ref 0.0–0.1)
EOS ABS: 0 10*3/uL (ref 0.0–0.7)
EOS PCT: 0 %
HCT: 39.8 % (ref 36.0–46.0)
Hemoglobin: 13.8 g/dL (ref 12.0–15.0)
Lymphocytes Relative: 6 %
Lymphs Abs: 0.8 10*3/uL (ref 0.7–4.0)
MCH: 35.3 pg — ABNORMAL HIGH (ref 26.0–34.0)
MCHC: 34.7 g/dL (ref 30.0–36.0)
MCV: 101.8 fL — ABNORMAL HIGH (ref 78.0–100.0)
Monocytes Absolute: 1 10*3/uL (ref 0.1–1.0)
Monocytes Relative: 8 %
Neutro Abs: 11.4 10*3/uL — ABNORMAL HIGH (ref 1.7–7.7)
Neutrophils Relative %: 86 %
PLATELETS: 255 10*3/uL (ref 150–400)
RBC: 3.91 MIL/uL (ref 3.87–5.11)
RDW: 12.5 % (ref 11.5–15.5)
WBC: 13.3 10*3/uL — ABNORMAL HIGH (ref 4.0–10.5)

## 2017-05-28 LAB — WET PREP, GENITAL
Clue Cells Wet Prep HPF POC: NONE SEEN
Sperm: NONE SEEN
Trich, Wet Prep: NONE SEEN
Yeast Wet Prep HPF POC: NONE SEEN

## 2017-05-28 LAB — I-STAT CG4 LACTIC ACID, ED
LACTIC ACID, VENOUS: 1.63 mmol/L (ref 0.5–1.9)
Lactic Acid, Venous: 1.65 mmol/L (ref 0.5–1.9)

## 2017-05-28 LAB — I-STAT BETA HCG BLOOD, ED (MC, WL, AP ONLY): I-stat hCG, quantitative: <5 m[IU]/mL (ref <5)

## 2017-05-28 LAB — RAPID STREP SCREEN (MED CTR MEBANE ONLY): Streptococcus, Group A Screen (Direct): NEGATIVE

## 2017-05-28 MED ORDER — IBUPROFEN 400 MG PO TABS
600.0000 mg | ORAL_TABLET | Freq: Once | ORAL | Status: AC
  Administered 2017-05-28: 13:00:00 600 mg via ORAL
  Filled 2017-05-28: qty 1

## 2017-05-28 MED ORDER — AZITHROMYCIN 250 MG PO TABS
1000.0000 mg | ORAL_TABLET | Freq: Once | ORAL | Status: AC
  Administered 2017-05-28: 1000 mg via ORAL
  Filled 2017-05-28: qty 4

## 2017-05-28 MED ORDER — CEFTRIAXONE SODIUM 250 MG IJ SOLR
250.0000 mg | Freq: Once | INTRAMUSCULAR | Status: AC
  Administered 2017-05-28: 250 mg via INTRAMUSCULAR
  Filled 2017-05-28: qty 250

## 2017-05-28 MED ORDER — STERILE WATER FOR INJECTION IJ SOLN
INTRAMUSCULAR | Status: AC
  Administered 2017-05-28: 1.2 mL
  Filled 2017-05-28: qty 10

## 2017-05-28 NOTE — ED Triage Notes (Addendum)
Pt reports having a severe sore throat for several days, now has difficulty swallowing. Has fever/chills. HR 120's at triage. Reports headache and bodyaches.

## 2017-05-28 NOTE — Discharge Instructions (Signed)
Please read attached information. If you experience any new or worsening signs or symptoms please return to the emergency room for evaluation. Please follow-up with your primary care provider or specialist as discussed.  °

## 2017-05-28 NOTE — ED Provider Notes (Signed)
[facility] EMERGENCY DEPARTMENT Provider Note   CSN: [id number] Arrival date & time: 05/28/17  4098  History   Chief Complaint Chief Complaint  Patient presents with  . Sore Throat    HPI Terri Herrera is a 27 y.o. female.  HPI   27 year old female presents today with complaints of sore throat.  Patient notes yesterday she developed sore throat, painful swallowing, hot and cold chills and headache.  Patient notes she is having lower back pain as well.  She notes this stems from a epidural that flares up from time to time.  Patient has difficulty ranging her neck due to throat discomfort.  He is able to tolerate fluids, but reports this causes significant discomfort.  Patient did not take any medication prior to arrival.  She has a young child at home.  She denies any significant past medical history.  No confusion.  History reviewed. No pertinent past medical history.    Patient Active Problem List   Diagnosis Date Noted  . Foot pain, right 06/04/2016  . Plantar wart of right foot 06/04/2016    History reviewed. No pertinent surgical history.  OB History    Gravida Para Term Preterm AB Living   4 1 1   3 1    SAB TAB Ectopic Multiple Live Births   3     0 1      Obstetric Comments   Admitted ETOH daily and tobacco use during pregnancy       Home Medications    Prior to Admission medications   Medication Sig Start Date End Date Taking? Authorizing Provider  doxycycline (VIBRA-TABS) 100 MG tablet Take 1 tablet (100 mg total) by mouth 2 (two) times daily. 05/18/17   Sharlene Dory, DO  oxyCODONE (OXY IR/ROXICODONE) 5 MG immediate release tablet Take 1 tablet (5 mg total) by mouth every 6 (six) hours as needed for severe pain. 05/18/17   Sharlene Dory, DO    Family History Family History  Problem Relation Age of Onset  . Cancer Paternal Grandmother   . Asthma Mother   . Diabetes Mother   . Alcohol abuse Mother   . Cancer Father     . Cancer Paternal Uncle     Social History Social History   Tobacco Use  . Smoking status: Current Every Day Smoker    Packs/day: 0.25  . Smokeless tobacco: Never Used  Substance Use Topics  . Alcohol use: Yes    Alcohol/week: 7.2 oz    Types: 12 Cans of beer per week  . Drug use: No     Allergies   Acetaminophen   Review of Systems Review of Systems  All other systems reviewed and are negative.    Physical Exam Updated Vital Signs BP 107/76 (BP Location: Right Arm)   Pulse (!) 112   Temp 99.9 F (37.7 C)   Resp 16   Ht 5\' 3"  (1.6 m)   Wt 56.7 kg (125 lb)   LMP 05/22/2017   SpO2 96%   BMI 22.14 kg/m   Physical Exam  Constitutional: She is oriented to person, place, and time. She appears well-developed and well-nourished.  HENT:  Head: Normocephalic and atraumatic.  Bilateral tonsillar swelling 2+, exudate noted, no asymmetrical swelling, no signs of peritonsillar abscess, no signs of retropharyngeal abscess, no pooling of secretion uvula midline with no edema no swelling rises with phonation  Exquisite tenderness anterior bilateral lymphadenopathy no swelling of the neck-limited range of motion  secondary to patient's discomfort  Eyes: Conjunctivae are normal. Pupils are equal, round, and reactive to light. Right eye exhibits no discharge. Left eye exhibits no discharge. No scleral icterus.  Neck: Normal range of motion. No JVD present. No tracheal deviation present.  Pulmonary/Chest: Effort normal and breath sounds normal. No stridor. No respiratory distress. She has no wheezes. She has no rales. She exhibits no tenderness.  Genitourinary:  Genitourinary Comments: Purulent vaginal discharge noted in vaginal vault, no cervical motion tenderness, no adnexal masses or tenderness  Musculoskeletal:  Tenderness palpation of right lower lumbar musculature-Kernig sign negative  Neurological: She is alert and oriented to person, place, and time. Coordination normal.   Skin: Skin is warm.  Psychiatric: She has a normal mood and affect. Her behavior is normal. Judgment and thought content normal.  Nursing note and vitals reviewed.   ED Treatments / Results  Labs (all labs ordered are listed, but only abnormal results are displayed) Labs Reviewed  WET PREP, GENITAL - Abnormal; Notable for the following components:      Result Value   WBC, Wet Prep HPF POC MODERATE (*)    All other components within normal limits  COMPREHENSIVE METABOLIC PANEL - Abnormal; Notable for the following components:   Glucose, Bld 107 (*)    Alkaline Phosphatase 21 (*)    All other components within normal limits  CBC WITH DIFFERENTIAL/PLATELET - Abnormal; Notable for the following components:   WBC 13.3 (*)    MCV 101.8 (*)    MCH 35.3 (*)    Neutro Abs 11.4 (*)    All other components within normal limits  RAPID STREP SCREEN (NOT AT [facility])  CULTURE, GROUP A STREP ([facility])  I-STAT CG4 LACTIC ACID, ED  I-STAT BETA HCG BLOOD, ED ([facility], [facility], [facility] ONLY)  I-STAT CG4 LACTIC ACID, ED  GC/CHLAMYDIA PROBE AMP ([place]) NOT AT [facility]    EKG  EKG Interpretation None      Radiology No results found.  Procedures Procedures (including critical care time)  Medications Ordered in ED Medications  ibuprofen (ADVIL,MOTRIN) tablet 600 mg (600 mg Oral Given 05/28/17 1236)  cefTRIAXone (ROCEPHIN) injection 250 mg (250 mg Intramuscular Given 05/28/17 1300)  azithromycin (ZITHROMAX) tablet 1,000 mg (1,000 mg Oral Given 05/28/17 1300)  sterile water (preservative free) injection (1.2 mLs  Given 05/28/17 1301)     Initial Impression / Assessment and Plan / ED Course  I have reviewed the triage vital signs and the nursing notes.  Pertinent labs & imaging results that were available during my care of the patient were reviewed by me and considered in my medical decision making (see chart for details).    Final Clinical Impressions(s) / ED Diagnoses   Final diagnoses:  Pharyngitis,  unspecified etiology  Vaginal discharge    Labs: Wet prep, GC, i-STAT lactic acid i-STAT beta-hCG, CMP, CBC, rapid strep  Imaging:  Consults:  Therapeutics: Ceftriaxone, azithromycin  Discharge Meds:   Assessment/Plan: 27 year old female presents today with complaints of sore throat.  Patient's presentation is most consistent with viral pharyngitis.  Medications prior to arrival patient is tachycardic.  Low suspicion for acute bacterial infections no signs of deep space infection.  Patient originally had limited range of motion of the neck, after ibuprofen she had full active range of motion of the neck without pain, very low suspicion for meningitis..  I have low suspicion for acute meningitis in this patient, this is likely secondary to her having a sore throat.  Patient has  negative Kernig sign, is well-appearing.  Patient also having vaginal discharge will be treated prophylactically for STDs, she will return if symptoms persist or immediately if they worsen.  Patient is given strict return precautions, she verbalized understanding and agreement to today's plan had no further questions or concerns at the time of discharge.  ED Discharge Orders    None       Eyvonne MechanicHedges, Leigh Blas, Cordelia Poche-C 05/28/17 1358    Mancel BaleWentz, Elliott, MD 05/29/17 [phone]

## 2017-05-29 LAB — GC/CHLAMYDIA PROBE AMP (~~LOC~~) NOT AT ARMC
Chlamydia: NEGATIVE
Neisseria Gonorrhea: NEGATIVE

## 2017-05-30 LAB — CULTURE, GROUP A STREP (THRC)

## 2017-08-15 ENCOUNTER — Encounter (HOSPITAL_COMMUNITY): Payer: Self-pay | Admitting: Emergency Medicine

## 2017-08-15 ENCOUNTER — Emergency Department (HOSPITAL_COMMUNITY)
Admission: EM | Admit: 2017-08-15 | Discharge: 2017-08-15 | Disposition: A | Discharge status: Home / Self Care | Payer: Medicaid Other | Attending: Emergency Medicine | Admitting: Emergency Medicine

## 2017-08-15 ENCOUNTER — Other Ambulatory Visit: Payer: Self-pay

## 2017-08-15 DIAGNOSIS — M79671 Pain in right foot: Secondary | ICD-10-CM | POA: Diagnosis not present

## 2017-08-15 DIAGNOSIS — Z5321 Procedure and treatment not carried out due to patient leaving prior to being seen by health care provider: Secondary | ICD-10-CM | POA: Diagnosis not present

## 2017-08-15 NOTE — ED Notes (Signed)
Called for Pt. 4x while in waiting room when room became available. No response from Pt.  Asked lobby tech if they had idea of where Pt. was indicated that they had not seen Pt. recently.

## 2017-08-15 NOTE — ED Triage Notes (Signed)
Pt presents to ED for assessment of right foot pain after she picked at a spot on her foot where she stepped on a thorn approx 6 months ago.  States she saw green discharge from it and is not concerned about infection.  States difficulty to bear weight.  Also c/o cut to same foot from two years ago that "might be affecting it".

## 2017-08-15 NOTE — ED Notes (Signed)
Patient di not answer when called for rooming.  Will d/c.

## 2017-08-15 NOTE — ED Notes (Signed)
Pt called for room no response.  

## 2017-08-16 NOTE — ED Notes (Signed)
Follow up call made  08/16/17 1051  s Anjalee Cope rn

## 2017-10-09 ENCOUNTER — Ambulatory Visit (HOSPITAL_COMMUNITY)
Admission: EM | Admit: 2017-10-09 | Discharge: 2017-10-09 | Disposition: A | Discharge status: Home / Self Care | Payer: Medicaid Other | Attending: Internal Medicine | Admitting: Internal Medicine

## 2017-10-09 ENCOUNTER — Encounter (HOSPITAL_COMMUNITY): Payer: Self-pay

## 2017-10-09 DIAGNOSIS — N925 Other specified irregular menstruation: Secondary | ICD-10-CM | POA: Diagnosis not present

## 2017-10-09 DIAGNOSIS — Z3201 Encounter for pregnancy test, result positive: Secondary | ICD-10-CM | POA: Diagnosis not present

## 2017-10-09 DIAGNOSIS — R1031 Right lower quadrant pain: Secondary | ICD-10-CM

## 2017-10-09 LAB — POCT URINALYSIS DIP (DEVICE)
Bilirubin Urine: NEGATIVE
Glucose, UA: NEGATIVE mg/dL
Hgb urine dipstick: NEGATIVE
Ketones, ur: NEGATIVE mg/dL
LEUKOCYTES UA: NEGATIVE
Nitrite: NEGATIVE
Protein, ur: NEGATIVE mg/dL
SPECIFIC GRAVITY, URINE: 1.02 (ref 1.005–1.030)
Urobilinogen, UA: 1 mg/dL (ref 0.0–1.0)
pH: 6.5 (ref 5.0–8.0)

## 2017-10-09 LAB — POCT PREGNANCY, URINE: Preg Test, Ur: POSITIVE — AB

## 2017-10-09 NOTE — ED Provider Notes (Signed)
[facility]   [id number] 10/09/17 Arrival Time: 1114  SUBJECTIVE:  Anastasia Pallbony M Klute is a 27 y.o. female who presents with complaint of abdominal discomfort that began abruptly 1 week ago.  Denies a precipitating event, or trauma.  Localizes pain to RLQ.  Describes as intermittent, stable, and sharp in character.  Has tried OTC medications like ibuprofen with temporary relief.  Denies alleviating or aggravating factors.  Worse with spine hyperextension.  Denies similar symptoms in the past.  Last BM yesterday and normal for patient.  Complains of change in appetite, and breast tenderness.    Denies fever, chills, weight changes, nausea, vomiting, chest pain, SOB, diarrhea, constipation, hematochezia, melena, dysuria, difficulty urinating, increased frequency or urgency, flank pain, loss of bowel or bladder function, vaginal bleeding, or abdominal cramping.  Patient's last menstrual period was 07/16/2017. Last unprotected sex 3 days ago.  Currently not on birth control.    ROS: As per HPI.  History reviewed. No pertinent past medical history. History reviewed. No pertinent surgical history. Allergies  Allergen Reactions  . Acetaminophen     No reaction; pt prefers not to take   No current facility-administered medications on file prior to encounter.    No current outpatient medications on file prior to encounter.   Social History   Socioeconomic History  . Marital status: Single    Spouse name: Not on file  . Number of children: Not on file  . Years of education: Not on file  . Highest education level: Not on file  Occupational History  . Not on file  Social Needs  . Financial resource strain: Not on file  . Food insecurity:    Worry: Not on file    Inability: Not on file  . Transportation needs:    Medical: Not on file    Non-medical: Not on file  Tobacco Use  . Smoking status: Current Every Day Smoker    Packs/day: 0.25  . Smokeless tobacco: Never Used    Substance and Sexual Activity  . Alcohol use: Yes    Alcohol/week: 7.2 oz    Types: 12 Cans of beer per week  . Drug use: No  . Sexual activity: Yes    Birth control/protection: None  Lifestyle  . Physical activity:    Days per week: Not on file    Minutes per session: Not on file  . Stress: Not on file  Relationships  . Social connections:    Talks on phone: Not on file    Gets together: Not on file    Attends religious service: Not on file    Active member of club or organization: Not on file    Attends meetings of clubs or organizations: Not on file    Relationship status: Not on file  . Intimate partner violence:    Fear of current or ex partner: Not on file    Emotionally abused: Not on file    Physically abused: Not on file    Forced sexual activity: Not on file  Other Topics Concern  . Not on file  Social History Narrative  . Not on file   Family History  Problem Relation Age of Onset  . Cancer Paternal Grandmother   . Asthma Mother   . Diabetes Mother   . Alcohol abuse Mother   . Cancer Father   . Cancer Paternal Uncle      OBJECTIVE:  Vitals:   10/09/17 1141  BP: 113/72  Pulse: 96  Resp:  18  Temp: 98.2 F (36.8 C)  TempSrc: Oral  SpO2: 100%    General appearance: AOx3 in no acute distress HEENT: NCAT.  Oropharynx clear.  Lungs: clear to auscultation bilaterally without adventitious breath sounds Heart: regular rate and rhythm.  Radial pulses 2+ symmetrical bilaterally Abdomen: soft, non-distended; normal active bowel sounds; Tender to light and deep palpation about the RLQ; negative Murphy's sign; negative rebound; + guarding Back: no CVA tenderness Extremities: no edema; symmetrical with no gross deformities Skin: warm and dry Neurologic: normal gait Psychological: alert and cooperative; normal mood and affect  Labs: Results for orders placed or performed during the hospital encounter of 10/09/17 (from the past 24 hour(s))  POCT urinalysis  dip (device)     Status: None   Collection Time: 10/09/17 11:45 AM  Result Value Ref Range   Glucose, UA NEGATIVE NEGATIVE mg/dL   Bilirubin Urine NEGATIVE NEGATIVE   Ketones, ur NEGATIVE NEGATIVE mg/dL   Specific Gravity, Urine 1.020 1.005 - 1.030   Hgb urine dipstick NEGATIVE NEGATIVE   pH 6.5 5.0 - 8.0   Protein, ur NEGATIVE NEGATIVE mg/dL   Urobilinogen, UA 1.0 0.0 - 1.0 mg/dL   Nitrite NEGATIVE NEGATIVE   Leukocytes, UA NEGATIVE NEGATIVE  Pregnancy, urine POC     Status: Abnormal   Collection Time: 10/09/17 11:53 AM  Result Value Ref Range   Preg Test, Ur POSITIVE (A) NEGATIVE   ASSESSMENT & PLAN:  1. Positive pregnancy test   2. RLQ abdominal pain     No orders of the defined types were placed in this encounter.  Positive urine pregnancy and RLQ abdominal pain.  LMP 07/19/17 Recommend further evaluation and management at [facility] (see below for address) Patient aware and in agreement with this plan  Reviewed expectations re: course of current medical issues. Questions answered. Outlined signs and symptoms indicating need for more acute intervention. Patient verbalized understanding. After Visit Summary given.   Rennis Harding, PA-C 10/09/17 1247

## 2017-10-09 NOTE — ED Triage Notes (Signed)
Pt presents with abdominal pain in lower right quadrant .

## 2017-10-09 NOTE — Discharge Instructions (Addendum)
Positive urine pregnancy and RLQ abdominal pain.  LMP 07/19/17 Recommend further evaluation and management at [facility] (see below for address) Patient aware and in agreement with this plan

## 2017-10-11 ENCOUNTER — Encounter (HOSPITAL_COMMUNITY): Payer: Self-pay | Admitting: *Deleted

## 2017-10-11 ENCOUNTER — Inpatient Hospital Stay (HOSPITAL_COMMUNITY)
Admission: AD | Admit: 2017-10-11 | Discharge: 2017-10-11 | Disposition: A | Discharge status: Home / Self Care | Payer: Medicaid Other | Source: Ambulatory Visit | Attending: Obstetrics & Gynecology | Admitting: Obstetrics & Gynecology

## 2017-10-11 DIAGNOSIS — Z3490 Encounter for supervision of normal pregnancy, unspecified, unspecified trimester: Secondary | ICD-10-CM | POA: Diagnosis not present

## 2017-10-11 DIAGNOSIS — Z3491 Encounter for supervision of normal pregnancy, unspecified, first trimester: Secondary | ICD-10-CM | POA: Insufficient documentation

## 2017-10-11 DIAGNOSIS — O99331 Smoking (tobacco) complicating pregnancy, first trimester: Secondary | ICD-10-CM | POA: Insufficient documentation

## 2017-10-11 DIAGNOSIS — Z3A12 12 weeks gestation of pregnancy: Secondary | ICD-10-CM | POA: Diagnosis not present

## 2017-10-11 LAB — CBC
HEMATOCRIT: 32.3 % — AB (ref 36.0–46.0)
Hemoglobin: 11.1 g/dL — ABNORMAL LOW (ref 12.0–15.0)
MCH: 34.2 pg — AB (ref 26.0–34.0)
MCHC: 34.4 g/dL (ref 30.0–36.0)
MCV: 99.4 fL (ref 78.0–100.0)
Platelets: 259 10*3/uL (ref 150–400)
RBC: 3.25 MIL/uL — ABNORMAL LOW (ref 3.87–5.11)
RDW: 12.2 % (ref 11.5–15.5)
WBC: 6.3 10*3/uL (ref 4.0–10.5)

## 2017-10-11 LAB — HCG, QUANTITATIVE, PREGNANCY: hCG, Beta Chain, Quant, S: 108648 m[IU]/mL — ABNORMAL HIGH (ref <5)

## 2017-10-11 MED ORDER — PRENATAL VITAMIN 27-0.8 MG PO TABS: 1.0000 | ORAL_TABLET | Freq: Every day | ORAL | Status: DC

## 2017-10-11 NOTE — Discharge Instructions (Signed)
Second Trimester of Pregnancy The second trimester is from week 13 through week 28, month 4 through 6. This is often the time in pregnancy that you feel your best. Often times, morning sickness has lessened or quit. You may have more energy, and you may get hungry more often. Your unborn baby (fetus) is growing rapidly. At the end of the sixth month, he or she is about 9 inches long and weighs about 1 pounds. You will likely feel the baby move (quickening) between 18 and 20 weeks of pregnancy. Follow these instructions at home:  Avoid all smoking, herbs, and alcohol. Avoid drugs not approved by your doctor.  Do not use any tobacco products, including cigarettes, chewing tobacco, and electronic cigarettes. If you need help quitting, ask your doctor. You may get counseling or other support to help you quit.  Only take medicine as told by your doctor. Some medicines are safe and some are not during pregnancy.  Exercise only as told by your doctor. Stop exercising if you start having cramps.  Eat regular, healthy meals.  Wear a good support bra if your breasts are tender.  Do not use hot tubs, steam rooms, or saunas.  Wear your seat belt when driving.  Avoid raw meat, uncooked cheese, and liter boxes and soil used by cats.  Take your prenatal vitamins.  Take 1500-2000 milligrams of calcium daily starting at the 20th week of pregnancy until you deliver your baby.  Try taking medicine that helps you poop (stool softener) as needed, and if your doctor approves. Eat more fiber by eating fresh fruit, vegetables, and whole grains. Drink enough fluids to keep your pee (urine) clear or pale yellow.  Take warm water baths (sitz baths) to soothe pain or discomfort caused by hemorrhoids. Use hemorrhoid cream if your doctor approves.  If you have puffy, bulging veins (varicose veins), wear support hose. Raise (elevate) your feet for 15 minutes, 3-4 times a day. Limit salt in your diet.  Avoid heavy  lifting, wear low heals, and sit up straight.  Rest with your legs raised if you have leg cramps or low back pain.  Visit your dentist if you have not gone during your pregnancy. Use a soft toothbrush to brush your teeth. Be gentle when you floss.  You can have sex (intercourse) unless your doctor tells you not to.  Go to your doctor visits. Get help if:  You feel dizzy.  You have mild cramps or pressure in your lower belly (abdomen).  You have a nagging pain in your belly area.  You continue to feel sick to your stomach (nauseous), throw up (vomit), or have watery poop (diarrhea).  You have bad smelling fluid coming from your vagina.  You have pain with peeing (urination). Get help right away if:  You have a fever.  You are leaking fluid from your vagina.  You have spotting or bleeding from your vagina.  You have severe belly cramping or pain.  You lose or gain weight rapidly.  You have trouble catching your breath and have chest pain.  You notice sudden or extreme puffiness (swelling) of your face, hands, ankles, feet, or legs.  You have not felt the baby move in over an hour.  You have severe headaches that do not go away with medicine.  You have vision changes. This information is not intended to replace advice given to you by your health care provider. Make sure you discuss any questions you have with your health care   provider. Document Released: 05/23/2009 Document Revised: 08/04/2015 Document Reviewed: 04/29/2012 Elsevier Interactive Patient Education  2017 [employer]vier Inc.  

## 2017-10-11 NOTE — MAU Note (Signed)
Was seen at Urgent Care 10/09/17. Was told to come here for u/s. Was having pain then but no pain now. Does have pain RLQ when "stretching" but ok today. No bleeding

## 2017-10-11 NOTE — MAU Provider Note (Signed)
History     CSN: [id number]  Arrival date and time: 10/11/17 1815   First Provider Initiated Contact with Patient 10/11/17 2054      Chief Complaint  Patient presents with  . Possible Pregnancy   HPI  Terri Herrera is a 27 y.o. [phone] at [redacted]w[redacted]d by LMP who presents to MAU with request for ultrasound "so I can see how big my baby is". Patient states she was seen at urgent care for muscle pain on 10/09/17 and was told she could come to the MAU for an ultrasound. Denies complaints at this time including pain, cramping, bleeding, fever, falls.   OB History    Gravida  5   Para  1   Term  1   Preterm      AB  3   Living  1     SAB  3   TAB      Ectopic      Multiple  0   Live Births  1        Obstetric Comments  Admitted ETOH daily and tobacco use during pregnancy        No past medical history on file.  No past surgical history on file.  Family History  Problem Relation Age of Onset  . Cancer Paternal Grandmother   . Asthma Mother   . Diabetes Mother   . Alcohol abuse Mother   . Cancer Father   . Cancer Paternal Uncle     Social History   Tobacco Use  . Smoking status: Current Every Day Smoker    Packs/day: 0.25  . Smokeless tobacco: Never Used  Substance Use Topics  . Alcohol use: Yes    Alcohol/week: 7.2 oz    Types: 12 Cans of beer per week  . Drug use: No    Allergies:  Allergies  Allergen Reactions  . Acetaminophen     No reaction; pt prefers not to take    No medications prior to admission.    Review of Systems  Constitutional: Negative for chills, fatigue and fever.  Respiratory: Negative for shortness of breath.   Gastrointestinal: Negative for abdominal pain, diarrhea, nausea and vomiting.  Genitourinary: Negative for vaginal bleeding, vaginal discharge and vaginal pain.  Neurological: Negative for headaches.  All other systems reviewed and are negative.  Physical Exam   Blood pressure 119/68, pulse (!) 103,  temperature 98 F (36.7 C), resp. rate 18, height 5\' 3"  (1.6 m), weight 125 lb (56.7 kg), last menstrual period 07/16/2017.  Physical Exam  Nursing note and vitals reviewed. Constitutional: She is oriented to person, place, and time. She appears well-developed and well-nourished.  Cardiovascular: Normal rate.  Respiratory: Effort normal and breath sounds normal.  GI: Soft. Bowel sounds are normal.  Neurological: She is alert and oriented to person, place, and time. She has normal reflexes.  Skin: Skin is warm and dry.  Psychiatric: She has a normal mood and affect. Her behavior is normal. Judgment and thought content normal.    MAU Course  Procedures  MDM --Patient denies all physical complaints Patient Vitals for the past 24 hrs:  BP Temp Pulse Resp Height Weight  10/11/17 1931 119/68 98 F (36.7 C) (!) 103 18 5\' 3"  (1.6 m) 125 lb (56.7 kg)    Results for orders placed or performed during the hospital encounter of 10/11/17 (from the past 24 hour(s))  CBC     Status: Abnormal   Collection Time: 10/11/17  7:01 PM  Result Value Ref Range   WBC 6.3 4.0 - 10.5 K/uL   RBC 3.25 (L) 3.87 - 5.11 MIL/uL   Hemoglobin 11.1 (L) 12.0 - 15.0 g/dL   HCT 16.132.3 (L) 09.636.0 - 04.546.0 %   MCV 99.4 78.0 - 100.0 fL   MCH 34.2 (H) 26.0 - 34.0 pg   MCHC 34.4 30.0 - 36.0 g/dL   RDW 40.912.2 81.111.5 - 91.415.5 %   Platelets 259 150 - 400 K/uL  hCG, quantitative, pregnancy     Status: Abnormal   Collection Time: 10/11/17  7:01 PM  Result Value Ref Range   hCG, Beta Chain, Quant, S [phone] (H) <5 mIU/mL     Assessment and Plan  --27 y.o. [id number] with pregnancy of unknown GA --Outpatient US ordered --Discussed daily prenatal vitamin --Discharge home instable condition  Calvert CantorSamantha C Weinhold, CNM 10/11/2017, 9:15 PM

## 2017-10-28 LAB — CYTOLOGY - PAP
Glucose 1 Hour: 84
Pap: NEGATIVE
URINE CULTURE, OB: NEGATIVE

## 2017-10-28 LAB — OB RESULTS CONSOLE RUBELLA ANTIBODY, IGM: RUBELLA: IMMUNE

## 2017-10-28 LAB — OB RESULTS CONSOLE ANTIBODY SCREEN: ANTIBODY SCREEN: NEGATIVE

## 2017-10-28 LAB — OB RESULTS CONSOLE RPR: RPR: NONREACTIVE

## 2017-10-28 LAB — OB RESULTS CONSOLE HEPATITIS B SURFACE ANTIGEN: Hepatitis B Surface Ag: NEGATIVE

## 2017-10-28 LAB — OB RESULTS CONSOLE GC/CHLAMYDIA
CHLAMYDIA, DNA PROBE: NEGATIVE
GC PROBE AMP, GENITAL: NEGATIVE

## 2017-10-28 LAB — OB RESULTS CONSOLE ABO/RH: RH TYPE: POSITIVE

## 2017-10-28 LAB — OB RESULTS CONSOLE HIV ANTIBODY (ROUTINE TESTING): HIV: NONREACTIVE

## 2017-10-29 ENCOUNTER — Other Ambulatory Visit (HOSPITAL_COMMUNITY): Payer: Self-pay | Admitting: Family

## 2017-10-29 DIAGNOSIS — Z3A19 19 weeks gestation of pregnancy: Secondary | ICD-10-CM

## 2017-10-29 DIAGNOSIS — O99330 Smoking (tobacco) complicating pregnancy, unspecified trimester: Secondary | ICD-10-CM

## 2017-10-29 DIAGNOSIS — Z3689 Encounter for other specified antenatal screening: Secondary | ICD-10-CM

## 2017-10-29 DIAGNOSIS — O99312 Alcohol use complicating pregnancy, second trimester: Secondary | ICD-10-CM

## 2017-10-29 DIAGNOSIS — F1010 Alcohol abuse, uncomplicated: Secondary | ICD-10-CM

## 2017-11-13 ENCOUNTER — Encounter: Payer: Self-pay | Admitting: *Deleted

## 2017-11-15 ENCOUNTER — Ambulatory Visit (INDEPENDENT_AMBULATORY_CARE_PROVIDER_SITE_OTHER): Payer: Medicaid Other | Admitting: Obstetrics & Gynecology

## 2017-11-15 ENCOUNTER — Encounter: Payer: Self-pay | Admitting: *Deleted

## 2017-11-15 ENCOUNTER — Encounter: Payer: Self-pay | Admitting: Obstetrics & Gynecology

## 2017-11-15 DIAGNOSIS — O0992 Supervision of high risk pregnancy, unspecified, second trimester: Secondary | ICD-10-CM | POA: Diagnosis not present

## 2017-11-15 DIAGNOSIS — O0990 Supervision of high risk pregnancy, unspecified, unspecified trimester: Secondary | ICD-10-CM | POA: Insufficient documentation

## 2017-11-15 DIAGNOSIS — Z8759 Personal history of other complications of pregnancy, childbirth and the puerperium: Secondary | ICD-10-CM

## 2017-11-15 DIAGNOSIS — O09899 Supervision of other high risk pregnancies, unspecified trimester: Secondary | ICD-10-CM | POA: Insufficient documentation

## 2017-11-15 LAB — POCT URINALYSIS DIP (DEVICE)
BILIRUBIN URINE: NEGATIVE
GLUCOSE, UA: NEGATIVE mg/dL
Hgb urine dipstick: NEGATIVE
KETONES UR: NEGATIVE mg/dL
LEUKOCYTES UA: NEGATIVE
Nitrite: NEGATIVE
Protein, ur: NEGATIVE mg/dL
SPECIFIC GRAVITY, URINE: 1.02 (ref 1.005–1.030)
Urobilinogen, UA: 1 mg/dL (ref 0.0–1.0)
pH: 7 (ref 5.0–8.0)

## 2017-11-15 NOTE — Progress Notes (Signed)
Here for new ob - transfer [facility]. Already has mychart. Signed up for [employer]. App. Declines flu shot.  PMH completed

## 2017-11-15 NOTE — Progress Notes (Signed)
  Subjective:referred by Terri Herrera is a [id number] [redacted]w[redacted]d being seen today for her first obstetrical visit.  Her obstetrical history is significant for smoker and previous SGA infant. Patient does intend to breast feed. Pregnancy history fully reviewed.  Patient reports sharp LLQ pain.  Vitals:   11/15/17 0948  BP: 111/61  Pulse: 92  Weight: 127 lb 8 oz (57.8 kg)    HISTORY: OB History  Gravida Para Term Preterm AB Living  5 1 1   3 1   SAB TAB Ectopic Multiple Live Births  3     0 1    # Outcome Date GA Lbr Len/2nd Weight Sex Delivery Anes PTL Lv  5 Current           4 Term 05/24/14 [redacted]w[redacted]d 14:28 / 00:35 4 lb 15 oz (2.24 kg) F Vag-Spont EPI  LIV  3 SAB 2014          2 SAB 2008          1 SAB 2008             Birth Comments: System Generated. Please review and update pregnancy details.    Obstetric Comments  Admitted ETOH daily and tobacco use during pregnancy   History reviewed. No pertinent past medical history. History reviewed. No pertinent surgical history. Family History  Problem Relation Age of Onset  . Cancer Paternal Grandmother   . Asthma Mother   . Diabetes Mother   . Alcohol abuse Mother   . Cancer Father   . Cancer Paternal Uncle      Exam    Uterus:   18 week  Pelvic Exam:                                    Skin: normal coloration and turgor, no rashes    Neurologic: oriented, normal mood   Extremities: normal strength, tone, and muscle mass   HEENT sclera clear, anicteric   Mouth/Teeth dental hygiene good   Neck supple   Cardiovascular: regular rate and rhythm   Respiratory:  appears well, vitals normal, no respiratory distress, acyanotic, normal RR   Abdomen: gravid and soft   Urinary:        Assessment:    Pregnancy: [id number] Patient Active Problem List   Diagnosis Date Noted  . Supervision of high risk pregnancy, antepartum 11/15/2017  . History of prior pregnancy with SGA newborn 11/15/2017  . Foot pain, right  06/04/2016  . Plantar wart of right foot 06/04/2016        Plan:     Initial labs drawn. Prenatal vitamins. Problem list reviewed and updated. Genetic Screening discussed Quad Screen: results reviewed.  Ultrasound discussed; fetal survey: ordered.  Follow up in 4 weeks. 50% of 30 min visit spent on counseling and coordination of care.     Scheryl Darter 11/15/2017

## 2017-11-15 NOTE — Patient Instructions (Signed)
Second Trimester of Pregnancy The second trimester is from week 13 through week 28, month 4 through 6. This is often the time in pregnancy that you feel your best. Often times, morning sickness has lessened or quit. You may have more energy, and you may get hungry more often. Your unborn baby (fetus) is growing rapidly. At the end of the sixth month, he or she is about 9 inches long and weighs about 1 pounds. You will likely feel the baby move (quickening) between 18 and 20 weeks of pregnancy. Follow these instructions at home:  Avoid all smoking, herbs, and alcohol. Avoid drugs not approved by your doctor.  Do not use any tobacco products, including cigarettes, chewing tobacco, and electronic cigarettes. If you need help quitting, ask your doctor. You may get counseling or other support to help you quit.  Only take medicine as told by your doctor. Some medicines are safe and some are not during pregnancy.  Exercise only as told by your doctor. Stop exercising if you start having cramps.  Eat regular, healthy meals.  Wear a good support bra if your breasts are tender.  Do not use hot tubs, steam rooms, or saunas.  Wear your seat belt when driving.  Avoid raw meat, uncooked cheese, and liter boxes and soil used by cats.  Take your prenatal vitamins.  Take 1500-2000 milligrams of calcium daily starting at the 20th week of pregnancy until you deliver your baby.  Try taking medicine that helps you poop (stool softener) as needed, and if your doctor approves. Eat more fiber by eating fresh fruit, vegetables, and whole grains. Drink enough fluids to keep your pee (urine) clear or pale yellow.  Take warm water baths (sitz baths) to soothe pain or discomfort caused by hemorrhoids. Use hemorrhoid cream if your doctor approves.  If you have puffy, bulging veins (varicose veins), wear support hose. Raise (elevate) your feet for 15 minutes, 3-4 times a day. Limit salt in your diet.  Avoid heavy  lifting, wear low heals, and sit up straight.  Rest with your legs raised if you have leg cramps or low back pain.  Visit your dentist if you have not gone during your pregnancy. Use a soft toothbrush to brush your teeth. Be gentle when you floss.  You can have sex (intercourse) unless your doctor tells you not to.  Go to your doctor visits. Get help if:  You feel dizzy.  You have mild cramps or pressure in your lower belly (abdomen).  You have a nagging pain in your belly area.  You continue to feel sick to your stomach (nauseous), throw up (vomit), or have watery poop (diarrhea).  You have bad smelling fluid coming from your vagina.  You have pain with peeing (urination). Get help right away if:  You have a fever.  You are leaking fluid from your vagina.  You have spotting or bleeding from your vagina.  You have severe belly cramping or pain.  You lose or gain weight rapidly.  You have trouble catching your breath and have chest pain.  You notice sudden or extreme puffiness (swelling) of your face, hands, ankles, feet, or legs.  You have not felt the baby move in over an hour.  You have severe headaches that do not go away with medicine.  You have vision changes. This information is not intended to replace advice given to you by your health care provider. Make sure you discuss any questions you have with your health care   provider. Document Released: 05/23/2009 Document Revised: 08/04/2015 Document Reviewed: 04/29/2012 Elsevier Interactive Patient Education  2017 Elsevier Inc.  

## 2017-11-18 ENCOUNTER — Encounter (HOSPITAL_COMMUNITY): Payer: Self-pay

## 2017-11-26 ENCOUNTER — Ambulatory Visit (HOSPITAL_COMMUNITY)
Admission: RE | Admit: 2017-11-26 | Discharge: 2017-11-26 | Disposition: A | Discharge status: Home / Self Care | Payer: Medicaid Other | Source: Ambulatory Visit | Attending: Family | Admitting: Family

## 2017-11-26 ENCOUNTER — Encounter (HOSPITAL_COMMUNITY): Payer: Self-pay

## 2017-12-02 ENCOUNTER — Encounter: Payer: Self-pay | Admitting: *Deleted

## 2017-12-16 ENCOUNTER — Other Ambulatory Visit: Payer: Self-pay

## 2017-12-16 ENCOUNTER — Inpatient Hospital Stay (HOSPITAL_COMMUNITY)
Admission: AD | Admit: 2017-12-16 | Discharge: 2017-12-16 | Disposition: A | Discharge status: Home / Self Care | Payer: Medicaid Other | Source: Ambulatory Visit | Attending: Obstetrics and Gynecology | Admitting: Obstetrics and Gynecology

## 2017-12-16 ENCOUNTER — Encounter: Payer: Medicaid Other | Admitting: Obstetrics & Gynecology

## 2017-12-16 ENCOUNTER — Encounter (HOSPITAL_COMMUNITY): Payer: Self-pay

## 2017-12-16 DIAGNOSIS — Z3A21 21 weeks gestation of pregnancy: Secondary | ICD-10-CM | POA: Diagnosis not present

## 2017-12-16 DIAGNOSIS — O99012 Anemia complicating pregnancy, second trimester: Secondary | ICD-10-CM | POA: Insufficient documentation

## 2017-12-16 DIAGNOSIS — R102 Pelvic and perineal pain: Secondary | ICD-10-CM | POA: Diagnosis not present

## 2017-12-16 DIAGNOSIS — O0992 Supervision of high risk pregnancy, unspecified, second trimester: Secondary | ICD-10-CM | POA: Insufficient documentation

## 2017-12-16 DIAGNOSIS — O99612 Diseases of the digestive system complicating pregnancy, second trimester: Secondary | ICD-10-CM | POA: Insufficient documentation

## 2017-12-16 DIAGNOSIS — Z833 Family history of diabetes mellitus: Secondary | ICD-10-CM | POA: Diagnosis not present

## 2017-12-16 DIAGNOSIS — K5900 Constipation, unspecified: Secondary | ICD-10-CM | POA: Diagnosis not present

## 2017-12-16 DIAGNOSIS — O0990 Supervision of high risk pregnancy, unspecified, unspecified trimester: Secondary | ICD-10-CM

## 2017-12-16 DIAGNOSIS — O99013 Anemia complicating pregnancy, third trimester: Secondary | ICD-10-CM | POA: Diagnosis not present

## 2017-12-16 DIAGNOSIS — O99332 Smoking (tobacco) complicating pregnancy, second trimester: Secondary | ICD-10-CM | POA: Diagnosis not present

## 2017-12-16 DIAGNOSIS — D509 Iron deficiency anemia, unspecified: Secondary | ICD-10-CM | POA: Diagnosis not present

## 2017-12-16 DIAGNOSIS — O26892 Other specified pregnancy related conditions, second trimester: Secondary | ICD-10-CM | POA: Insufficient documentation

## 2017-12-16 DIAGNOSIS — O09892 Supervision of other high risk pregnancies, second trimester: Secondary | ICD-10-CM

## 2017-12-16 DIAGNOSIS — O99019 Anemia complicating pregnancy, unspecified trimester: Secondary | ICD-10-CM

## 2017-12-16 DIAGNOSIS — R109 Unspecified abdominal pain: Secondary | ICD-10-CM | POA: Diagnosis present

## 2017-12-16 DIAGNOSIS — O26899 Other specified pregnancy related conditions, unspecified trimester: Secondary | ICD-10-CM

## 2017-12-16 LAB — URINALYSIS, ROUTINE W REFLEX MICROSCOPIC
BILIRUBIN URINE: NEGATIVE
Glucose, UA: NEGATIVE mg/dL
HGB URINE DIPSTICK: NEGATIVE
Ketones, ur: NEGATIVE mg/dL
Leukocytes, UA: NEGATIVE
NITRITE: NEGATIVE
PH: 6 (ref 5.0–8.0)
Protein, ur: NEGATIVE mg/dL
SPECIFIC GRAVITY, URINE: 1.021 (ref 1.005–1.030)

## 2017-12-16 LAB — CBC
HEMATOCRIT: 26.6 % — AB (ref 36.0–46.0)
Hemoglobin: 9.4 g/dL — ABNORMAL LOW (ref 12.0–15.0)
MCH: 34.3 pg — AB (ref 26.0–34.0)
MCHC: 35.3 g/dL (ref 30.0–36.0)
MCV: 97.1 fL (ref 78.0–100.0)
PLATELETS: 204 10*3/uL (ref 150–400)
RBC: 2.74 MIL/uL — AB (ref 3.87–5.11)
RDW: 12.2 % (ref 11.5–15.5)
WBC: 6.5 10*3/uL (ref 4.0–10.5)

## 2017-12-16 LAB — WET PREP, GENITAL
CLUE CELLS WET PREP: NONE SEEN
Sperm: NONE SEEN
Trich, Wet Prep: NONE SEEN
Yeast Wet Prep HPF POC: NONE SEEN

## 2017-12-16 MED ORDER — POLYSACCHARIDE IRON COMPLEX 150 MG PO CAPS: 150.0000 mg | ORAL_CAPSULE | Freq: Every day | ORAL | 2 refills | Status: DC

## 2017-12-16 MED ORDER — PRENATAL VITAMIN 27-0.8 MG PO TABS: 1.0000 | ORAL_TABLET | Freq: Every day | ORAL | 4 refills | Status: DC

## 2017-12-16 NOTE — MAU Provider Note (Signed)
History     CSN: [id number]  Arrival date and time: 12/16/17 1658   First Provider Initiated Contact with Patient 12/16/17 1804      Chief Complaint  Patient presents with  . Abdominal Pain   G5P1031 @21 .6 wks here with abd pain since last night. Pain is sharp and intermittent. Pain is worse when she yells. No VB or discharge. Located bilateral and from umbilicus to lower abdomen. No urinary sx. No BM since yesterday (usually has daily). Pt is requesting [place] today because she missed her anatomy US last week.   OB History    Gravida  5   Para  1   Term  1   Preterm      AB  3   Living  1     SAB  3   TAB      Ectopic      Multiple  0   Live Births  1        Obstetric Comments  Admitted ETOH daily and tobacco use during pregnancy        History reviewed. No pertinent past medical history.  History reviewed. No pertinent surgical history.  Family History  Problem Relation Age of Onset  . Cancer Paternal Grandmother   . Asthma Mother   . Diabetes Mother   . Alcohol abuse Mother   . Cancer Father   . Cancer Paternal Uncle     Social History   Tobacco Use  . Smoking status: Light Tobacco Smoker    Packs/day: 0.25    Last attempt to quit: 12/16/2017  . Smokeless tobacco: Never Used  Substance Use Topics  . Alcohol use: Not Currently  . Drug use: No    Allergies:  Allergies  Allergen Reactions  . Acetaminophen     No reaction; pt prefers not to take    Medications Prior to Admission  Medication Sig Dispense Refill Last Dose  . [DISCONTINUED] Prenatal Vit-Fe Fumarate-FA (PRENATAL VITAMIN) 27-0.8 MG TABS Take 1 tablet by mouth at bedtime. 30 tablet  12/15/2017 at Unknown time    Review of Systems  Constitutional: Negative for chills and fever.  Gastrointestinal: Positive for abdominal pain and constipation. Negative for diarrhea, nausea and vomiting.  Genitourinary: Negative for dysuria, frequency and hematuria.   Physical Exam   Blood  pressure 113/68, pulse 88, temperature 98.1 F (36.7 C), temperature source Oral, resp. rate 18, weight 59.5 kg, last menstrual period 07/16/2017, SpO2 100 %.  Physical Exam  Constitutional: She is oriented to person, place, and time. She appears well-developed and well-nourished. No distress.  HENT:  Head: Normocephalic and atraumatic.  Neck: Normal range of motion.  Cardiovascular: Normal rate.  Respiratory: Effort normal. No respiratory distress.  GI: Soft. She exhibits no distension and no mass. There is tenderness in the right lower quadrant, periumbilical area and left lower quadrant. There is no rebound and no guarding.  gravid  Genitourinary:  Genitourinary Comments: SVE: closed/long  Musculoskeletal: Normal range of motion.  Neurological: She is alert and oriented to person, place, and time.  Skin: Skin is warm and dry.  Psychiatric: She has a normal mood and affect.  FHT 164  Results for orders placed or performed during the hospital encounter of 12/16/17 (from the past 24 hour(s))  Urinalysis, Routine w reflex microscopic     Status: Abnormal   Collection Time: 12/16/17  6:25 PM  Result Value Ref Range   Color, Urine YELLOW YELLOW   APPearance HAZY (A) CLEAR  Specific Gravity, Urine 1.021 1.005 - 1.030   pH 6.0 5.0 - 8.0   Glucose, UA NEGATIVE NEGATIVE mg/dL   Hgb urine dipstick NEGATIVE NEGATIVE   Bilirubin Urine NEGATIVE NEGATIVE   Ketones, ur NEGATIVE NEGATIVE mg/dL   Protein, ur NEGATIVE NEGATIVE mg/dL   Nitrite NEGATIVE NEGATIVE   Leukocytes, UA NEGATIVE NEGATIVE  Wet prep, genital     Status: Abnormal   Collection Time: 12/16/17  6:30 PM  Result Value Ref Range   Yeast Wet Prep HPF POC NONE SEEN NONE SEEN   Trich, Wet Prep NONE SEEN NONE SEEN   Clue Cells Wet Prep HPF POC NONE SEEN NONE SEEN   WBC, Wet Prep HPF POC FEW (A) NONE SEEN   Sperm NONE SEEN   CBC     Status: Abnormal   Collection Time: 12/16/17  6:52 PM  Result Value Ref Range   WBC 6.5 4.0 -  10.5 K/uL   RBC 2.74 (L) 3.87 - 5.11 MIL/uL   Hemoglobin 9.4 (L) 12.0 - 15.0 g/dL   HCT 56.2 (L) 13.0 - 86.5 %   MCV 97.1 78.0 - 100.0 fL   MCH 34.3 (H) 26.0 - 34.0 pg   MCHC 35.3 30.0 - 36.0 g/dL   RDW 78.4 69.6 - 29.5 %   Platelets 204 150 - 400 K/uL    MAU Course  Procedures  MDM Labs ordered and reviewed. No evidence of PTL or UTI. Will treat anemia. Discussed measures for constipation. May also be having some RL pain. Stable for discharge home.  Assessment and Plan   1. [redacted] weeks gestation of pregnancy   2. Supervision of high risk pregnancy, antepartum   3. Constipation, unspecified constipation type   4. Pain of round ligament during pregnancy   5. Iron deficiency anemia during pregnancy    Discharge home Follow up in OB office- need to call and reschedule PTL precautions Rx Ferrex and PNV  Allergies as of 12/16/2017      Reactions   Acetaminophen    No reaction; pt prefers not to take      Medication List    TAKE these medications   iron polysaccharides 150 MG capsule Commonly known as:  NIFEREX Take 1 capsule (150 mg total) by mouth daily.   Prenatal Vitamin 27-0.8 MG Tabs Take 1 tablet by mouth at bedtime.      Donette Larry, CNM 12/16/2017, 7:30 PM

## 2017-12-16 NOTE — MAU Note (Signed)
Abdominal pain since 12/15/17 at 2300, constant , 7/10 pain score

## 2017-12-16 NOTE — MAU Note (Signed)
Having pains across her stomach. Hasn't taken a poop all day.  Was to have had a [place] last Monday, was in [place] burrying a cousin.

## 2017-12-16 NOTE — Discharge Instructions (Signed)
Pregnancy and Anemia Anemia is a condition in which the concentration of red blood cells or hemoglobin in the blood is below normal. Hemoglobin is a substance in red blood cells that carries oxygen to the tissues of the body. Anemia results in not enough oxygen reaching these tissues. Anemia during pregnancy is common because the fetus uses more iron and folic acid as it is developing. Your body may not produce enough red blood cells because of this. Also, during pregnancy, the liquid part of the blood (plasma) increases by about 50%, and the red blood cells increase by only 25%. This lowers the concentration of the red blood cells and creates a natural anemia-like situation. What are the causes? The most common cause of anemia during pregnancy is not having enough iron in the body to make red blood cells (iron deficiency anemia). Other causes may include:  Folic acid deficiency.  Vitamin B12 deficiency.  Certain prescription or over-the-counter medicines.  Certain medical conditions or infections that destroy red blood cells.  A low platelet count and bleeding caused by antibodies that go through the placenta to the fetus from the mothers blood.  What are the signs or symptoms? Mild anemia may not be noticeable. If it becomes severe, symptoms may include:  Tiredness.  Shortness of breath, especially with exercise.  Weakness.  Fainting.  Pale looking skin.  Headaches.  Feeling a fast or irregular heartbeat (palpitations).  How is this diagnosed? The type of anemia is usually diagnosed from your family and medical history and blood tests. How is this treated? Treatment of anemia during pregnancy depends on the cause of the anemia. Treatment can include:  Supplements of iron, vitamin B12, or folic acid.  A blood transfusion. This may be needed if blood loss is severe.  Hospitalization. This may be needed if there is significant continual blood loss.  Dietary  changes.  Follow these instructions at home:  Follow your dietitian's or health care provider's dietary recommendations.  Increase your vitamin C intake. This will help the stomach absorb more iron.  Eat a diet rich in iron. This would include foods such as: ? Liver. ? Beef. ? Whole grain bread. ? Eggs. ? Dried fruit.  Take iron and vitamins as directed by your health care provider.  Eat green leafy vegetables. These are a good source of folic acid. Contact a health care provider if:  You have frequent or lasting headaches.  You are looking pale.  You are bruising easily. Get help right away if:  You have extreme weakness, shortness of breath, or chest pain.  You become dizzy or have trouble concentrating.  You have heavy vaginal bleeding.  You develop a rash.  You have bloody or black, tarry stools.  You faint.  You vomit up blood.  You vomit repeatedly.  You have abdominal pain.  You have a fever or persistent symptoms for more than 2-3 days.  You have a fever and your symptoms suddenly get worse.  You are dehydrated. This information is not intended to replace advice given to you by your health care provider. Make sure you discuss any questions you have with your health care provider. Document Released: 02/24/2000 Document Revised: 08/04/2015 Document Reviewed: 10/08/2012 Elsevier Interactive Patient Education  2017 Elsevier Inc.  Round Ligament Pain The round ligament is a cord of muscle and tissue that helps to support the uterus. It can become a source of pain during pregnancy if it becomes stretched or twisted as the baby grows. The  pain usually begins in the second trimester of pregnancy, and it can come and go until the baby is delivered. It is not a serious problem, and it does not cause harm to the baby. Round ligament pain is usually a short, sharp, and pinching pain, but it can also be a dull, lingering, and aching pain. The pain is felt in the  lower side of the abdomen or in the groin. It usually starts deep in the groin and moves up to the outside of the hip area. Pain can occur with:  A sudden change in position.  Rolling over in bed.  Coughing or sneezing.  Physical activity.  Follow these instructions at home: Watch your condition for any changes. Take these steps to help with your pain:  When the pain starts, relax. Then try: ? Sitting down. ? Flexing your knees up to your abdomen. ? Lying on your side with one pillow under your abdomen and another pillow between your legs. ? Sitting in a warm bath for 15-20 minutes or until the pain goes away.  Take over-the-counter and prescription medicines only as told by your health care provider.  Move slowly when you sit and stand.  Avoid long walks if they cause pain.  Stop or lessen your physical activities if they cause pain.  Contact a health care provider if:  Your pain does not go away with treatment.  You feel pain in your back that you did not have before.  Your medicine is not helping. Get help right away if:  You develop a fever or chills.  You develop uterine contractions.  You develop vaginal bleeding.  You develop nausea or vomiting.  You develop diarrhea.  You have pain when you urinate. This information is not intended to replace advice given to you by your health care provider. Make sure you discuss any questions you have with your health care provider. Document Released: 12/06/2007 Document Revised: 08/04/2015 Document Reviewed: 05/05/2014 Elsevier Interactive Patient Education  2018 [employer].   Constipation, Adult Constipation is when a person:  Poops (has a bowel movement) fewer times in a week than normal.  Has a hard time pooping.  Has poop that is dry, hard, or bigger than normal.  Follow these instructions at home: Eating and drinking   Eat foods that have a lot of fiber, such as: ? Fresh fruits and  vegetables. ? Whole grains. ? Beans.  Eat less of foods that are high in fat, low in fiber, or overly processed, such as: ? [place] fries. ? Hamburgers. ? Cookies. ? Candy. ? Soda.  Drink enough fluid to keep your pee (urine) clear or pale yellow. General instructions  Exercise regularly or as told by your doctor.  Go to the restroom when you feel like you need to poop. Do not hold it in.  Take over-the-counter and prescription medicines only as told by your doctor. These include any fiber supplements.  Do pelvic floor retraining exercises, such as: ? Doing deep breathing while relaxing your lower belly (abdomen). ? Relaxing your pelvic floor while pooping.  Watch your condition for any changes.  Keep all follow-up visits as told by your doctor. This is important. Contact a doctor if:  You have pain that gets worse.  You have a fever.  You have not pooped for 4 days.  You throw up (vomit).  You are not hungry.  You lose weight.  You are bleeding from the anus.  You have thin, pencil-like poop (stool).  Get help right away if:  You have a fever, and your symptoms suddenly get worse.  You leak poop or have blood in your poop.  Your belly feels hard or bigger than normal (is bloated).  You have very bad belly pain.  You feel dizzy or you faint. This information is not intended to replace advice given to you by your health care provider. Make sure you discuss any questions you have with your health care provider. Document Released: 08/15/2007 Document Revised: 09/16/2015 Document Reviewed: 08/17/2015 Elsevier Interactive Patient Education  2018 [employer].

## 2017-12-17 LAB — GC/CHLAMYDIA PROBE AMP (~~LOC~~) NOT AT ARMC
Chlamydia: NEGATIVE
NEISSERIA GONORRHEA: NEGATIVE

## 2017-12-18 ENCOUNTER — Ambulatory Visit (HOSPITAL_COMMUNITY)
Admission: RE | Admit: 2017-12-18 | Discharge: 2017-12-18 | Disposition: A | Discharge status: Home / Self Care | Payer: Medicaid Other | Source: Ambulatory Visit | Attending: Family | Admitting: Family

## 2017-12-18 ENCOUNTER — Other Ambulatory Visit (HOSPITAL_COMMUNITY): Payer: Self-pay | Admitting: *Deleted

## 2017-12-18 ENCOUNTER — Encounter (HOSPITAL_COMMUNITY): Payer: Self-pay

## 2017-12-18 ENCOUNTER — Ambulatory Visit (INDEPENDENT_AMBULATORY_CARE_PROVIDER_SITE_OTHER): Payer: Medicaid Other | Admitting: Advanced Practice Midwife

## 2017-12-18 DIAGNOSIS — O0990 Supervision of high risk pregnancy, unspecified, unspecified trimester: Secondary | ICD-10-CM

## 2017-12-18 DIAGNOSIS — O09292 Supervision of pregnancy with other poor reproductive or obstetric history, second trimester: Secondary | ICD-10-CM | POA: Insufficient documentation

## 2017-12-18 DIAGNOSIS — Z8759 Personal history of other complications of pregnancy, childbirth and the puerperium: Secondary | ICD-10-CM | POA: Diagnosis not present

## 2017-12-18 DIAGNOSIS — O99330 Smoking (tobacco) complicating pregnancy, unspecified trimester: Secondary | ICD-10-CM

## 2017-12-18 DIAGNOSIS — Z3689 Encounter for other specified antenatal screening: Secondary | ICD-10-CM

## 2017-12-18 DIAGNOSIS — O99312 Alcohol use complicating pregnancy, second trimester: Secondary | ICD-10-CM | POA: Diagnosis present

## 2017-12-18 DIAGNOSIS — F1010 Alcohol abuse, uncomplicated: Secondary | ICD-10-CM | POA: Diagnosis not present

## 2017-12-18 DIAGNOSIS — O99310 Alcohol use complicating pregnancy, unspecified trimester: Principal | ICD-10-CM

## 2017-12-18 DIAGNOSIS — Z3A19 19 weeks gestation of pregnancy: Secondary | ICD-10-CM

## 2017-12-18 DIAGNOSIS — O99332 Smoking (tobacco) complicating pregnancy, second trimester: Secondary | ICD-10-CM | POA: Diagnosis not present

## 2017-12-18 DIAGNOSIS — Z23 Encounter for immunization: Secondary | ICD-10-CM | POA: Diagnosis not present

## 2017-12-18 DIAGNOSIS — O0992 Supervision of high risk pregnancy, unspecified, second trimester: Secondary | ICD-10-CM | POA: Diagnosis not present

## 2017-12-18 DIAGNOSIS — Z363 Encounter for antenatal screening for malformations: Secondary | ICD-10-CM | POA: Diagnosis not present

## 2017-12-18 HISTORY — DX: Gonococcal infection, unspecified: A54.9

## 2017-12-18 HISTORY — DX: Unspecified abnormal cytological findings in specimens from vagina: R87.629

## 2017-12-18 HISTORY — DX: Bronchitis, not specified as acute or chronic: J40

## 2017-12-18 NOTE — Progress Notes (Signed)
   PRENATAL VISIT NOTE  Subjective:  Terri Herrera is a 27 y.o. [phone] at [redacted]w[redacted]d being seen today for ongoing prenatal care.  She is currently monitored for the following issues for this low-risk pregnancy and has Foot pain, right; Plantar wart of right foot; Supervision of high risk pregnancy, antepartum; and History of prior pregnancy with SGA newborn on their problem list.  Patient reports no complaints.  Contractions: Not present. Vag. Bleeding: None.  Movement: Present. Denies leaking of fluid.   The following portions of the patient's history were reviewed and updated as appropriate: allergies, current medications, past family history, past medical history, past social history, past surgical history and problem list. Problem list updated.  Objective:   Vitals:   12/18/17 1202  BP: 113/65  Pulse: 83  Weight: 131 lb 11.2 oz (59.7 kg)    Fetal Status: Fetal Heart Rate (bpm): 160   Movement: Present     General:  Alert, oriented and cooperative. Patient is in no acute distress.  Skin: Skin is warm and dry. No rash noted.   Cardiovascular: Normal heart rate noted  Respiratory: Normal respiratory effort, no problems with respiration noted  Abdomen: Soft, gravid, appropriate for gestational age.  Pain/Pressure: Absent     Pelvic: Cervical exam deferred        Extremities: Normal range of motion.  Edema: None  Mental Status: Normal mood and affect. Normal behavior. Normal judgment and thought content.   Anatomy US today Nml, but incomplete.   Assessment and Plan:  Pregnancy: G5P1031 at [redacted]w[redacted]d  1. Supervision of high risk pregnancy, antepartum  - Flu Vaccine QUAD 36+ mos IM  Preterm labor symptoms and general obstetric precautions including but not limited to vaginal bleeding, contractions, leaking of fluid and fetal movement were reviewed in detail with the patient. Please refer to After Visit Summary for other counseling recommendations.  Return in about 4 weeks (around  01/15/2018).  Future Appointments  Date Time Provider Department Center  01/08/2018 11:15 AM [facility] N[facility]  01/15/2018 10:45 AM [facility] [place] 2 [facility]    Dorathy Kinsman, CNM

## 2017-12-18 NOTE — Patient Instructions (Signed)
Pregnancy and Influenza Influenza, also called the flu, is an infection of the respiratory tract. If you are pregnant, you are more likely to catch the flu. You are also more likely to have a more serious case of the flu. This is because pregnancy lowers your body's ability to fight off infections (it weakens your immune system). It also puts additional stress on your heart and lungs, which makes you more likely to have complications. Having a bad case of the flu, especially with a high fever, can be dangerous for your developing baby. It can cause you to go into early labor. How do people get the flu? The flu is caused by the influenza virus. This virus is common every year in the fall and winter. It spreads when virus particles get passed from person to person. You can get the virus if you are near a sick person who is coughing or sneezing. You can also get the virus if you touch something that has the virus on it and then touch your face. How can I protect myself against the flu?  Get a flu shot. The best way to prevent the flu is to get a flu shot before flu season starts. The flu shot is not dangerous for your developing baby. It may even help protect your baby from the flu for up to 6 months after birth. The flu shot is one type of flu vaccine. Another type is a nasal spray vaccine. Do not get the nasal spray vaccine. It is not approved for pregnancy.  Do not come in close contact with sick people.  Do not share food, drinks, or utensils with other people.  Wash your hands often. Use hand sanitizer when soap and water are not available. What should I do if I have flu symptoms? If you have any flu symptoms, call your health care provider right away. Flu symptoms include:  Fever or chills.  Muscle aches.  Headache.  Sore throat.  Nasal congestion.  Cough.  Feeling tired.  Loss of appetite.  Vomiting.  Diarrhea.  You may be able to take an antiviral medicine to keep the flu  from becoming severe and to shorten how long it lasts. What should I do at home if I am diagnosed with the flu?  Do not take any medicine, including cold or flu medicine, unless directed by your health care provider.  If you take antiviral medicine, make sure you finish it even if you start to feel better.  Drink enough fluid to keep your urine clear or pale yellow.  Get plenty of rest. When would I seek immediate medical care if I have the flu?  You have trouble breathing.  You have chest pain.  You begin to have labor pains.  You have a high fever that does not go down after you take medicine.  You do not feel your baby move.  You have diarrhea or vomiting that will not go away. This information is not intended to replace advice given to you by your health care provider. Make sure you discuss any questions you have with your health care provider. Document Released: 12/30/2007 Document Revised: 08/04/2015 Document Reviewed: 01/23/2013 Elsevier Interactive Patient Education  2017 Elsevier Inc.  

## 2018-01-08 ENCOUNTER — Encounter: Payer: Medicaid Other | Admitting: Family Medicine

## 2018-01-13 ENCOUNTER — Telehealth: Payer: Self-pay | Admitting: Family Medicine

## 2018-01-13 ENCOUNTER — Encounter: Payer: Medicaid Other | Admitting: Obstetrics & Gynecology

## 2018-01-13 ENCOUNTER — Encounter: Payer: Self-pay | Admitting: Obstetrics & Gynecology

## 2018-01-13 NOTE — Telephone Encounter (Signed)
Called pt to get her rescheduled for her missed appt on 11/4. No answer and VM not set-up. Mailed no-show letter.

## 2018-01-13 NOTE — Progress Notes (Deleted)
   Patient did not show up today for her scheduled appointment.   Aerica Rincon, MD, FACOG Obstetrician & Gynecologist, [facility], [facility]  

## 2018-01-15 ENCOUNTER — Ambulatory Visit (HOSPITAL_COMMUNITY)
Admission: RE | Admit: 2018-01-15 | Discharge: 2018-01-15 | Disposition: A | Discharge status: Home / Self Care | Payer: Medicaid Other | Source: Ambulatory Visit | Attending: Family | Admitting: Family

## 2018-01-21 ENCOUNTER — Encounter (HOSPITAL_COMMUNITY): Payer: Self-pay

## 2018-01-21 ENCOUNTER — Ambulatory Visit (HOSPITAL_COMMUNITY)
Admission: RE | Admit: 2018-01-21 | Discharge: 2018-01-21 | Disposition: A | Discharge status: Home / Self Care | Payer: Medicaid Other | Source: Ambulatory Visit | Attending: Family | Admitting: Family

## 2018-01-21 DIAGNOSIS — Z3A24 24 weeks gestation of pregnancy: Secondary | ICD-10-CM | POA: Diagnosis not present

## 2018-01-21 DIAGNOSIS — O99332 Smoking (tobacco) complicating pregnancy, second trimester: Secondary | ICD-10-CM | POA: Diagnosis not present

## 2018-01-21 DIAGNOSIS — O09292 Supervision of pregnancy with other poor reproductive or obstetric history, second trimester: Secondary | ICD-10-CM

## 2018-01-21 DIAGNOSIS — Z362 Encounter for other antenatal screening follow-up: Secondary | ICD-10-CM | POA: Diagnosis present

## 2018-01-21 DIAGNOSIS — O99312 Alcohol use complicating pregnancy, second trimester: Secondary | ICD-10-CM | POA: Insufficient documentation

## 2018-01-21 DIAGNOSIS — F1010 Alcohol abuse, uncomplicated: Secondary | ICD-10-CM

## 2018-01-21 DIAGNOSIS — O99310 Alcohol use complicating pregnancy, unspecified trimester: Secondary | ICD-10-CM

## 2018-01-21 DIAGNOSIS — O0990 Supervision of high risk pregnancy, unspecified, unspecified trimester: Secondary | ICD-10-CM

## 2018-01-22 ENCOUNTER — Other Ambulatory Visit (HOSPITAL_COMMUNITY): Payer: Self-pay | Admitting: *Deleted

## 2018-01-22 DIAGNOSIS — Z362 Encounter for other antenatal screening follow-up: Secondary | ICD-10-CM

## 2018-01-23 ENCOUNTER — Encounter: Payer: Medicaid Other | Admitting: Obstetrics and Gynecology

## 2018-01-23 ENCOUNTER — Ambulatory Visit (INDEPENDENT_AMBULATORY_CARE_PROVIDER_SITE_OTHER): Payer: Medicaid Other | Admitting: Obstetrics and Gynecology

## 2018-01-23 ENCOUNTER — Encounter: Payer: Self-pay | Admitting: Obstetrics and Gynecology

## 2018-01-23 VITALS — BP 114/67 | HR 111 | Wt 133.7 lb

## 2018-01-23 DIAGNOSIS — O0992 Supervision of high risk pregnancy, unspecified, second trimester: Secondary | ICD-10-CM

## 2018-01-23 DIAGNOSIS — O0990 Supervision of high risk pregnancy, unspecified, unspecified trimester: Secondary | ICD-10-CM

## 2018-01-23 DIAGNOSIS — Z8759 Personal history of other complications of pregnancy, childbirth and the puerperium: Secondary | ICD-10-CM

## 2018-01-23 MED ORDER — FLUCONAZOLE 150 MG PO TABS: 150.0000 mg | ORAL_TABLET | Freq: Once | ORAL | 0 refills | Status: AC

## 2018-01-23 NOTE — Progress Notes (Signed)
   PRENATAL VISIT NOTE  Subjective:  Terri Herrera is a 27 y.o. [phone] at 4042w4d being seen today for ongoing prenatal care.  She is currently monitored for the following issues for this high-risk pregnancy and has Foot pain, right; Plantar wart of right foot; Supervision of high risk pregnancy, antepartum; and History of prior pregnancy with SGA newborn on their problem list.  Patient reports no complaints.  Contractions: Not present. Vag. Bleeding: None.  Movement: Present. Denies leaking of fluid.   The following portions of the patient's history were reviewed and updated as appropriate: allergies, current medications, past family history, past medical history, past social history, past surgical history and problem list. Problem list updated.  Objective:   Vitals:   01/23/18 1044  BP: 114/67  Pulse: (!) 111  Weight: 133 lb 11.2 oz (60.6 kg)    Fetal Status: Fetal Heart Rate (bpm): 157 Fundal Height: 25 cm Movement: Present     General:  Alert, oriented and cooperative. Patient is in no acute distress.  Skin: Skin is warm and dry. No rash noted.   Cardiovascular: Normal heart rate noted  Respiratory: Normal respiratory effort, no problems with respiration noted  Abdomen: Soft, gravid, appropriate for gestational age.  Pain/Pressure: Present     Pelvic: Cervical exam deferred        Extremities: Normal range of motion.  Edema: None  Mental Status: Normal mood and affect. Normal behavior. Normal judgment and thought content.   Assessment and Plan:  Pregnancy: G5P1031 at 3942w4d  1. Supervision of high risk pregnancy, antepartum Patient is doing well without complaints Third trimester labs next visit  2. History of prior pregnancy with SGA newborn Normal growth ultrasound on 11/12 Follow up growth scheduled  Preterm labor symptoms and general obstetric precautions including but not limited to vaginal bleeding, contractions, leaking of fluid and fetal movement were reviewed in  detail with the patient. Please refer to After Visit Summary for other counseling recommendations.  Return in about 4 weeks (around 02/20/2018) for ROB, 2 hr glucola next visit.  Future Appointments  Date Time Provider Department Center  02/18/2018 10:30 AM [facility] US 1 [facility]    Catalina AntiguaPeggy Laporchia Nakajima, MD

## 2018-02-10 ENCOUNTER — Other Ambulatory Visit: Payer: Self-pay | Admitting: General Practice

## 2018-02-10 DIAGNOSIS — O0990 Supervision of high risk pregnancy, unspecified, unspecified trimester: Secondary | ICD-10-CM

## 2018-02-17 ENCOUNTER — Other Ambulatory Visit: Payer: Self-pay

## 2018-02-17 ENCOUNTER — Encounter (HOSPITAL_COMMUNITY): Payer: Self-pay | Admitting: Emergency Medicine

## 2018-02-17 ENCOUNTER — Ambulatory Visit (HOSPITAL_COMMUNITY)
Admission: EM | Admit: 2018-02-17 | Discharge: 2018-02-17 | Disposition: A | Discharge status: Home / Self Care | Payer: Medicaid Other | Attending: Family Medicine | Admitting: Family Medicine

## 2018-02-17 DIAGNOSIS — F17210 Nicotine dependence, cigarettes, uncomplicated: Secondary | ICD-10-CM | POA: Diagnosis not present

## 2018-02-17 DIAGNOSIS — Z113 Encounter for screening for infections with a predominantly sexual mode of transmission: Secondary | ICD-10-CM | POA: Diagnosis not present

## 2018-02-17 DIAGNOSIS — N898 Other specified noninflammatory disorders of vagina: Secondary | ICD-10-CM | POA: Diagnosis not present

## 2018-02-17 DIAGNOSIS — Z79899 Other long term (current) drug therapy: Secondary | ICD-10-CM | POA: Diagnosis not present

## 2018-02-17 DIAGNOSIS — Z792 Long term (current) use of antibiotics: Secondary | ICD-10-CM | POA: Insufficient documentation

## 2018-02-17 DIAGNOSIS — Z833 Family history of diabetes mellitus: Secondary | ICD-10-CM | POA: Diagnosis not present

## 2018-02-17 LAB — POCT URINALYSIS DIP (DEVICE)
Bilirubin Urine: NEGATIVE
Glucose, UA: NEGATIVE mg/dL
Hgb urine dipstick: NEGATIVE
Ketones, ur: NEGATIVE mg/dL
Nitrite: NEGATIVE
Protein, ur: 30 mg/dL — AB
SPECIFIC GRAVITY, URINE: 1.025 (ref 1.005–1.030)
Urobilinogen, UA: 0.2 mg/dL (ref 0.0–1.0)
pH: 6 (ref 5.0–8.0)

## 2018-02-17 NOTE — ED Triage Notes (Signed)
Patient wants std check.  Partner cheating.  Odor and discharge.

## 2018-02-17 NOTE — Discharge Instructions (Signed)
Your urine did not show any signs of infection We will send the swab off to check for STDs and other causes of discharge I recommend holding off on treatment given pregnancy in order to avoid unnecessary medicines We will call you with the results and provide any treatment if necessary.

## 2018-02-17 NOTE — ED Provider Notes (Signed)
[facility]    CSN: [id number] Arrival date & time: 02/17/18  0913     History   Chief Complaint Chief Complaint  Patient presents with  . SEXUALLY TRANSMITTED DISEASE    HPI Terri Herrera is a 27 y.o. female approximately 5 months pregnant presenting today for evaluation of vaginal discharge.  Patient states that she has had vaginal discharge with an odor that is been present over the past week.  She found out her partner was cheating on her and would like to be screened for STDs.  She denies any abdominal pain.  Denies any leaking of fluid or bleeding.  Denies cramping.  She has noticed a slight increase and frequency and discomfort with urination as well.  HPI  Past Medical History:  Diagnosis Date  . Bronchitis   . Gonorrhea   . Vaginal Pap smear, abnormal     Patient Active Problem List   Diagnosis Date Noted  . Supervision of high risk pregnancy, antepartum 11/15/2017  . History of prior pregnancy with SGA newborn 11/15/2017  . Foot pain, right 06/04/2016  . Plantar wart of right foot 06/04/2016    Past Surgical History:  Procedure Laterality Date  . WISDOM TOOTH EXTRACTION      OB History    Gravida  5   Para  1   Term  1   Preterm      AB  3   Living  1     SAB  3   TAB      Ectopic      Multiple  0   Live Births  1        Obstetric Comments  Admitted ETOH daily and tobacco use during pregnancy         Home Medications    Prior to Admission medications   Medication Sig Start Date End Date Taking? Authorizing Provider  iron polysaccharides (NIFEREX) 150 MG capsule Take 1 capsule (150 mg total) by mouth daily. Patient not taking: Reported on 12/18/2017 12/16/17   Donette Larry, CNM  metroNIDAZOLE (FLAGYL) 250 MG tablet Take 250 mg by mouth 3 (three) times daily.    [provider]  Prenatal Vit-Fe Fumarate-FA (PRENATAL VITAMIN) 27-0.8 MG TABS Take 1 tablet by mouth at bedtime. 12/16/17   Donette Larry, CNM      Family History Family History  Problem Relation Age of Onset  . Cancer Paternal Grandmother   . Asthma Mother   . Diabetes Mother   . Alcohol abuse Mother   . Cancer Father   . Cancer Paternal Uncle     Social History Social History   Tobacco Use  . Smoking status: Light Tobacco Smoker    Packs/day: 0.25    Last attempt to quit: 12/16/2017    Years since quitting: 0.1  . Smokeless tobacco: Never Used  Substance Use Topics  . Alcohol use: Not Currently  . Drug use: No     Allergies   Acetaminophen   Review of Systems Review of Systems  Constitutional: Negative for fever.  Respiratory: Negative for shortness of breath.   Cardiovascular: Negative for chest pain.  Gastrointestinal: Negative for abdominal pain, diarrhea, nausea and vomiting.  Genitourinary: Positive for dysuria and vaginal discharge. Negative for flank pain, genital sores, hematuria, menstrual problem, vaginal bleeding and vaginal pain.  Musculoskeletal: Negative for back pain.  Skin: Negative for rash.  Neurological: Negative for dizziness, light-headedness and headaches.     Physical Exam Triage Vital Signs  ED Triage Vitals  Enc Vitals Group     BP 02/17/18 1023 107/68     Pulse Rate 02/17/18 1023 (!) 110     Resp 02/17/18 1023 18     Temp 02/17/18 1023 98.8 F (37.1 C)     Temp Source 02/17/18 1023 Oral     SpO2 02/17/18 1023 99 %     Weight --      Height --      Head Circumference --      Peak Flow --      Pain Score 02/17/18 1022 0     Pain Loc --      Pain Edu? --      Excl. in GC? --    No data found.  Updated Vital Signs BP 107/68 (BP Location: Right Arm)   Pulse (!) 110   Temp 98.8 F (37.1 C) (Oral)   Resp 18   LMP 07/16/2017 (Approximate)   SpO2 99%   Visual Acuity Right Eye Distance:   Left Eye Distance:   Bilateral Distance:    Right Eye Near:   Left Eye Near:    Bilateral Near:     Physical Exam  Constitutional: She is oriented to person, place, and  time. She appears well-developed and well-nourished.  No acute distress  HENT:  Head: Normocephalic and atraumatic.  Nose: Nose normal.  Eyes: Conjunctivae are normal.  Neck: Neck supple.  Cardiovascular: Normal rate.  Pulmonary/Chest: Effort normal. No respiratory distress.  Abdominal: She exhibits no distension.  Musculoskeletal: Normal range of motion.  Neurological: She is alert and oriented to person, place, and time.  Skin: Skin is warm and dry.  Psychiatric: She has a normal mood and affect.  Nursing note and vitals reviewed.    UC Treatments / Results  Labs (all labs ordered are listed, but only abnormal results are displayed) Labs Reviewed  POCT URINALYSIS DIP (DEVICE) - Abnormal; Notable for the following components:      Result Value   Protein, ur 30 (*)    Leukocytes, UA TRACE (*)    All other components within normal limits  URINE CULTURE  CERVICOVAGINAL ANCILLARY ONLY    EKG None  Radiology No results found.  Procedures Procedures (including critical care time)  Medications Ordered in UC Medications - No data to display  Initial Impression / Assessment and Plan / UC Course  I have reviewed the triage vital signs and the nursing notes.  Pertinent labs & imaging results that were available during my care of the patient were reviewed by me and considered in my medical decision making (see chart for details).     Urine with trace leuks, will send off for culture.  Vaginal swab obtained to check for STDs.  Will wait on treatment given pregnancy status.  Will call patient with results and provide any further treatment if needed. Final Clinical Impressions(s) / UC Diagnoses   Final diagnoses:  Screen for STD (sexually transmitted disease)  Vaginal discharge     Discharge Instructions     Your urine did not show any signs of infection We will send the swab off to check for STDs and other causes of discharge I recommend holding off on treatment  given pregnancy in order to avoid unnecessary medicines We will call you with the results and provide any treatment if necessary.    ED Prescriptions    None     Controlled Substance Prescriptions [place] Controlled Substance Registry consulted? Not Applicable  Lew Dawes, PA-C 02/17/18 1106

## 2018-02-18 ENCOUNTER — Ambulatory Visit (HOSPITAL_COMMUNITY): Admission: RE | Admit: 2018-02-18 | Payer: Medicaid Other | Source: Ambulatory Visit

## 2018-02-18 LAB — CERVICOVAGINAL ANCILLARY ONLY
Bacterial vaginitis: POSITIVE — AB
Candida vaginitis: NEGATIVE
Chlamydia: NEGATIVE
NEISSERIA GONORRHEA: NEGATIVE
Trichomonas: POSITIVE — AB

## 2018-02-20 ENCOUNTER — Other Ambulatory Visit: Payer: Medicaid Other

## 2018-02-20 ENCOUNTER — Ambulatory Visit (HOSPITAL_COMMUNITY): Payer: Medicaid Other

## 2018-02-20 ENCOUNTER — Telehealth (HOSPITAL_COMMUNITY): Payer: Self-pay | Admitting: Emergency Medicine

## 2018-02-20 ENCOUNTER — Encounter: Payer: Medicaid Other | Admitting: Obstetrics & Gynecology

## 2018-02-20 MED ORDER — METRONIDAZOLE 500 MG PO TABS: 500.0000 mg | ORAL_TABLET | Freq: Two times a day (BID) | ORAL | 0 refills | Status: DC

## 2018-02-20 NOTE — Telephone Encounter (Signed)
Bacterial vaginosis is positive. This was not treated at the urgent care visit.  Flagyl 500 mg BID x 7 days #14 no refills sent to patients pharmacy of choice.    Trichomonas is positive. Pt needs education to refrain from sexual intercourse for 7 days to give the medicine time to work. Sexual partners need to be notified and tested/treated. Condoms may reduce risk of reinfection. Recheck for further evaluation if symptoms are not improving.  Pt is pregnant, per dr. Tracie HarrierHagler, okay to send this dose.    Pt contacted about results. All questions answered.

## 2018-02-28 ENCOUNTER — Ambulatory Visit (HOSPITAL_COMMUNITY): Admission: RE | Admit: 2018-02-28 | Payer: Medicaid Other | Source: Ambulatory Visit

## 2018-02-28 ENCOUNTER — Ambulatory Visit (INDEPENDENT_AMBULATORY_CARE_PROVIDER_SITE_OTHER): Payer: Medicaid Other | Admitting: Obstetrics & Gynecology

## 2018-02-28 ENCOUNTER — Other Ambulatory Visit (HOSPITAL_COMMUNITY)
Admission: RE | Admit: 2018-02-28 | Discharge: 2018-02-28 | Disposition: A | Discharge status: Home / Self Care | Payer: Medicaid Other | Source: Ambulatory Visit | Attending: Obstetrics & Gynecology | Admitting: Obstetrics & Gynecology

## 2018-02-28 ENCOUNTER — Other Ambulatory Visit: Payer: Medicaid Other

## 2018-02-28 VITALS — BP 116/65 | HR 89 | Wt 136.6 lb

## 2018-02-28 DIAGNOSIS — O26893 Other specified pregnancy related conditions, third trimester: Secondary | ICD-10-CM

## 2018-02-28 DIAGNOSIS — Z23 Encounter for immunization: Secondary | ICD-10-CM | POA: Diagnosis not present

## 2018-02-28 DIAGNOSIS — O0990 Supervision of high risk pregnancy, unspecified, unspecified trimester: Secondary | ICD-10-CM

## 2018-02-28 DIAGNOSIS — N898 Other specified noninflammatory disorders of vagina: Secondary | ICD-10-CM

## 2018-02-28 DIAGNOSIS — O0993 Supervision of high risk pregnancy, unspecified, third trimester: Secondary | ICD-10-CM

## 2018-02-28 DIAGNOSIS — Z8759 Personal history of other complications of pregnancy, childbirth and the puerperium: Secondary | ICD-10-CM

## 2018-02-28 NOTE — Progress Notes (Signed)
   PRENATAL VISIT NOTE  Subjective:  Terri Herrera is a 27 y.o. [phone] at 4722w5d being seen today for ongoing prenatal care.  She is currently monitored for the following issues for this high-risk pregnancy and has Foot pain, right; Plantar wart of right foot; Supervision of high risk pregnancy, antepartum; and History of prior pregnancy with SGA newborn on their problem list.  Patient reports vaginal discharge, was treated for bv.  Contractions: Not present. Vag. Bleeding: None.  Movement: Present. Denies leaking of fluid.   The following portions of the patient's history were reviewed and updated as appropriate: allergies, current medications, past family history, past medical history, past social history, past surgical history and problem list. Problem list updated.  Objective:   Vitals:   02/28/18 1048  BP: 116/65  Pulse: 89  Weight: 136 lb 9.6 oz (62 kg)    Fetal Status: Fetal Heart Rate (bpm): 159   Movement: Present     General:  Alert, oriented and cooperative. Patient is in no acute distress.  Skin: Skin is warm and dry. No rash noted.   Cardiovascular: Normal heart rate noted  Respiratory: Normal respiratory effort, no problems with respiration noted  Abdomen: Soft, gravid, appropriate for gestational age.  Pain/Pressure: Present     Pelvic: Cervical exam performed        Extremities: Normal range of motion.  Edema: None  Mental Status: Normal mood and affect. Normal behavior. Normal judgment and thought content.   Assessment and Plan:  Pregnancy: G5P1031 at 8222w5d  1. Supervision of high risk pregnancy, antepartum -28 week labs - TDAP - Glucose Tolerance, 1 Hour (because she ate candy this morning)  2. History of prior pregnancy with SGA newborn - growth u/s q 4 weeks with MFM  Preterm labor symptoms and general obstetric precautions including but not limited to vaginal bleeding, contractions, leaking of fluid and fetal movement were reviewed in detail with the  patient. Please refer to After Visit Summary for other counseling recommendations.  No follow-ups on file.  Future Appointments  Date Time Provider Department Center  02/28/2018 12:30 PM [facility] US 1 [facility]    Allie BossierMyra C Majed Pellegrin, MD

## 2018-02-28 NOTE — Addendum Note (Signed)
Addended by: Osvaldo HumanEISMA, Deshun Sedivy P on: 02/28/2018 11:42 AM   Modules accepted: Orders

## 2018-03-01 LAB — CBC
Hematocrit: 24.6 % — ABNORMAL LOW (ref 34.0–46.6)
Hemoglobin: 8.6 g/dL — ABNORMAL LOW (ref 11.1–15.9)
MCH: 32.6 pg (ref 26.6–33.0)
MCHC: 35 g/dL (ref 31.5–35.7)
MCV: 93 fL (ref 79–97)
Platelets: 275 10*3/uL (ref 150–450)
RBC: 2.64 x10E6/uL — AB (ref 3.77–5.28)
RDW: 11.9 % — ABNORMAL LOW (ref 12.3–15.4)
WBC: 7.3 10*3/uL (ref 3.4–10.8)

## 2018-03-01 LAB — RPR: RPR Ser Ql: NONREACTIVE

## 2018-03-01 LAB — HIV ANTIBODY (ROUTINE TESTING W REFLEX): HIV Screen 4th Generation wRfx: NONREACTIVE

## 2018-03-01 LAB — GLUCOSE TOLERANCE, 1 HOUR: GLUCOSE, 1HR PP: 82 mg/dL (ref 65–199)

## 2018-03-03 ENCOUNTER — Telehealth: Payer: Self-pay

## 2018-03-03 LAB — CERVICOVAGINAL ANCILLARY ONLY
Bacterial vaginitis: NEGATIVE
Candida vaginitis: NEGATIVE
Trichomonas: NEGATIVE

## 2018-03-03 NOTE — Telephone Encounter (Signed)
Called to advise pt to increase iron pills to 2 tabs but got a message stating the number was invalid.

## 2018-03-03 NOTE — Telephone Encounter (Signed)
-----   Message from Adam PhenixJames G Arnold, MD sent at 03/03/2018 11:14 AM EST ----- Due to anemia patient needs to increase iron to 2 tablets daily

## 2018-03-10 ENCOUNTER — Encounter (HOSPITAL_COMMUNITY): Payer: Self-pay

## 2018-03-10 ENCOUNTER — Ambulatory Visit (HOSPITAL_COMMUNITY)
Admission: RE | Admit: 2018-03-10 | Discharge: 2018-03-10 | Disposition: A | Discharge status: Home / Self Care | Payer: Medicaid Other | Source: Ambulatory Visit | Attending: Obstetrics & Gynecology | Admitting: Obstetrics & Gynecology

## 2018-03-10 DIAGNOSIS — O99333 Smoking (tobacco) complicating pregnancy, third trimester: Secondary | ICD-10-CM | POA: Diagnosis not present

## 2018-03-10 DIAGNOSIS — O0990 Supervision of high risk pregnancy, unspecified, unspecified trimester: Secondary | ICD-10-CM | POA: Diagnosis present

## 2018-03-10 DIAGNOSIS — O99313 Alcohol use complicating pregnancy, third trimester: Secondary | ICD-10-CM | POA: Diagnosis not present

## 2018-03-10 DIAGNOSIS — Z3A31 31 weeks gestation of pregnancy: Secondary | ICD-10-CM

## 2018-03-10 DIAGNOSIS — Z362 Encounter for other antenatal screening follow-up: Secondary | ICD-10-CM | POA: Diagnosis not present

## 2018-03-10 DIAGNOSIS — O09293 Supervision of pregnancy with other poor reproductive or obstetric history, third trimester: Secondary | ICD-10-CM

## 2018-03-11 ENCOUNTER — Other Ambulatory Visit (HOSPITAL_COMMUNITY): Payer: Self-pay | Admitting: *Deleted

## 2018-03-11 DIAGNOSIS — O99310 Alcohol use complicating pregnancy, unspecified trimester: Secondary | ICD-10-CM

## 2018-03-12 NOTE — L&D Delivery Note (Addendum)
Patient: Terri Herrera MRN: [id number]  GBS status: unknown with positive in prior pregnancy, IAP given: ampicillin given at delivery   Patient is a 28 y.o. now [id number] s/p NSVD at [redacted]w[redacted]d, who was admitted for early labor. SROM 1h 69m prior to delivery with clear fluid.    Delivery Note At 7:19 PM a viable female was delivered via Vaginal, Spontaneous (Presentation: cephalic; ROA).  APGAR: 8, 9; weight: pending (appears small, but AGA)  .   Placenta status: intact, 3-vessel cord.  Cord:  with the following complications: none.  Cord pH: not sent.  Head delivered ROA. No nuchal cord present. Shoulder and body delivered in usual fashion. Infant with spontaneous cry, placed on mother's abdomen, dried and bulb suctioned. Cord clamped x 2 after 1-minute delay, and cut by family member. Cord blood drawn. Placenta delivered spontaneously with gentle cord traction. Fundus firm with massage and Pitocin. Perineum inspected and found to have 1st degree laceration, which was repaired with 3.0 vicryl with good hemostasis achieved.  Anesthesia:  Epidural Episiotomy: None Lacerations:  1st degree perineal Suture Repair: 3.0 vicryl Est. Blood Loss (mL):  ~100  Mom to postpartum.  Baby to Couplet care / Skin to Skin.  Gwenevere Abbot 04/30/2018, 7:48 PM

## 2018-03-20 NOTE — Progress Notes (Deleted)
   PRENATAL VISIT NOTE  Subjective:  Terri Herrera is a 28 y.o. [phone] at [redacted]w[redacted]d being seen today for ongoing prenatal care.  She is currently monitored for the following issues for this high-risk pregnancy and has Foot pain, right; Plantar wart of right foot; Supervision of high risk pregnancy, antepartum; and History of prior pregnancy with SGA newborn on their problem list.  Patient reports {sx:14538}.   .  .   . Denies leaking of fluid.   The following portions of the patient's history were reviewed and updated as appropriate: allergies, current medications, past family history, past medical history, past social history, past surgical history and problem list. Problem list updated.  Objective:  There were no vitals filed for this visit.  Fetal Status:           General:  Alert, oriented and cooperative. Patient is in no acute distress.  Skin: Skin is warm and dry. No rash noted.   Cardiovascular: Normal heart rate noted  Respiratory: Normal respiratory effort, no problems with respiration noted  Abdomen: Soft, gravid, appropriate for gestational age.        Pelvic: {Blank single:19197::"Cervical exam performed","Cervical exam deferred"}        Extremities: Normal range of motion.     Mental Status: Normal mood and affect. Normal behavior. Normal judgment and thought content.   Assessment and Plan:  Pregnancy: G5P1031 at [redacted]w[redacted]d  1. Supervision of high risk pregnancy, antepartum ***  2. History of prior pregnancy with SGA newborn ***  {Blank single:19197::"Term","Preterm"} labor symptoms and general obstetric precautions including but not limited to vaginal bleeding, contractions, leaking of fluid and fetal movement were reviewed in detail with the patient. Please refer to After Visit Summary for other counseling recommendations.  No follow-ups on file.  Future Appointments  Date Time Provider Department Center  03/21/2018 10:55 AM [facility]  04/07/2018  10:00 AM [facility] [place] 5 [facility]    Dorathy Kinsman, [place]

## 2018-03-21 ENCOUNTER — Encounter: Payer: Medicaid Other | Admitting: Advanced Practice Midwife

## 2018-03-28 ENCOUNTER — Encounter: Payer: Self-pay | Admitting: Obstetrics and Gynecology

## 2018-03-28 ENCOUNTER — Ambulatory Visit (INDEPENDENT_AMBULATORY_CARE_PROVIDER_SITE_OTHER): Payer: Medicaid Other | Admitting: Obstetrics and Gynecology

## 2018-03-28 VITALS — BP 112/76 | HR 95 | Wt 134.4 lb

## 2018-03-28 DIAGNOSIS — Z3A33 33 weeks gestation of pregnancy: Secondary | ICD-10-CM

## 2018-03-28 DIAGNOSIS — F1010 Alcohol abuse, uncomplicated: Secondary | ICD-10-CM | POA: Insufficient documentation

## 2018-03-28 DIAGNOSIS — O0990 Supervision of high risk pregnancy, unspecified, unspecified trimester: Secondary | ICD-10-CM

## 2018-03-28 DIAGNOSIS — Z720 Tobacco use: Secondary | ICD-10-CM | POA: Insufficient documentation

## 2018-03-28 DIAGNOSIS — O0993 Supervision of high risk pregnancy, unspecified, third trimester: Secondary | ICD-10-CM

## 2018-03-28 DIAGNOSIS — O99310 Alcohol use complicating pregnancy, unspecified trimester: Secondary | ICD-10-CM

## 2018-03-28 DIAGNOSIS — Z8759 Personal history of other complications of pregnancy, childbirth and the puerperium: Secondary | ICD-10-CM

## 2018-03-28 NOTE — Progress Notes (Signed)
Prenatal Visit Note Date: 03/28/2018 Clinic: [facility]  Subjective:  Terri Herrera is a 28 y.o. [phone] at 2818w5d being seen today for ongoing prenatal care.  She is currently monitored for the following issues for this high-risk pregnancy and has Foot pain, right; Plantar wart of right foot; Supervision of high risk pregnancy, antepartum; History of prior pregnancy with SGA newborn; Alcohol abuse affecting pregnancy; and Tobacco abuse on their problem list.  Patient reports no appetite.   Contractions: Irritability. Vag. Bleeding: None.  Movement: Present. Denies leaking of fluid.   The following portions of the patient's history were reviewed and updated as appropriate: allergies, current medications, past family history, past medical history, past social history, past surgical history and problem list. Problem list updated.  Objective:   Vitals:   03/28/18 1033  BP: 112/76  Pulse: 95  Weight: 134 lb 6.4 oz (61 kg)    Fetal Status: Fetal Heart Rate (bpm): 160   Movement: Present     General:  Alert, oriented and cooperative. Patient is in no acute distress.  Skin: Skin is warm and dry. No rash noted.   Cardiovascular: Normal heart rate noted  Respiratory: Normal respiratory effort, no problems with respiration noted  Abdomen: Soft, gravid, appropriate for gestational age. Pain/Pressure: Present     Pelvic:  Cervical exam deferred        Extremities: Normal range of motion.  Edema: None  Mental Status: Normal mood and affect. Normal behavior. Normal judgment and thought content.   Urinalysis:      Assessment and Plan:  Pregnancy: G5P1031 at 4618w5d  1. Supervision of high risk pregnancy, antepartum No n/v of pregnancy s/s. Recommend grazing throughout the day, six small meals, try ensure drinks. Paragard. Down to one cigarette per day and denies any etoh use.   2. History of prior pregnancy with SGA newborn Getting regular growth u/s  Preterm labor  symptoms and general obstetric precautions including but not limited to vaginal bleeding, contractions, leaking of fluid and fetal movement were reviewed in detail with the patient. Please refer to After Visit Summary for other counseling recommendations.  Return in about 2 weeks (around 04/11/2018) for hrob.   [place] BingPickens, Shirlie Enck, MD

## 2018-04-07 ENCOUNTER — Encounter (HOSPITAL_COMMUNITY): Payer: Self-pay

## 2018-04-07 ENCOUNTER — Ambulatory Visit (HOSPITAL_COMMUNITY): Admission: RE | Admit: 2018-04-07 | Payer: Medicaid Other | Source: Ambulatory Visit

## 2018-04-10 ENCOUNTER — Ambulatory Visit (HOSPITAL_COMMUNITY): Admission: RE | Admit: 2018-04-10 | Payer: Medicaid Other | Source: Ambulatory Visit

## 2018-04-14 ENCOUNTER — Telehealth: Payer: Self-pay | Admitting: Obstetrics & Gynecology

## 2018-04-14 ENCOUNTER — Encounter: Payer: Self-pay | Admitting: Obstetrics & Gynecology

## 2018-04-14 ENCOUNTER — Ambulatory Visit (HOSPITAL_COMMUNITY): Admission: RE | Admit: 2018-04-14 | Payer: Medicaid Other | Source: Ambulatory Visit

## 2018-04-14 ENCOUNTER — Encounter: Payer: Medicaid Other | Admitting: Obstetrics & Gynecology

## 2018-04-14 NOTE — Telephone Encounter (Signed)
Attempted to call pt to get her rescheduled for her missed prenatal appt. Number stated that it is disconnected. No show later mailed.

## 2018-04-30 ENCOUNTER — Encounter (HOSPITAL_COMMUNITY): Payer: Self-pay | Admitting: *Deleted

## 2018-04-30 ENCOUNTER — Inpatient Hospital Stay (HOSPITAL_COMMUNITY): Payer: Medicaid Other

## 2018-04-30 ENCOUNTER — Inpatient Hospital Stay (HOSPITAL_COMMUNITY): Payer: Medicaid Other | Admitting: Anesthesiology

## 2018-04-30 ENCOUNTER — Other Ambulatory Visit: Payer: Self-pay

## 2018-04-30 ENCOUNTER — Inpatient Hospital Stay (HOSPITAL_COMMUNITY)
Admission: AD | Admit: 2018-04-30 | Discharge: 2018-05-02 | DRG: 807 | Disposition: A | Discharge status: Home / Self Care | Payer: Medicaid Other | Attending: Family Medicine | Admitting: Family Medicine

## 2018-04-30 DIAGNOSIS — O26893 Other specified pregnancy related conditions, third trimester: Secondary | ICD-10-CM | POA: Diagnosis present

## 2018-04-30 DIAGNOSIS — Z720 Tobacco use: Secondary | ICD-10-CM | POA: Diagnosis present

## 2018-04-30 DIAGNOSIS — O99314 Alcohol use complicating childbirth: Secondary | ICD-10-CM | POA: Diagnosis present

## 2018-04-30 DIAGNOSIS — O289 Unspecified abnormal findings on antenatal screening of mother: Secondary | ICD-10-CM | POA: Diagnosis present

## 2018-04-30 DIAGNOSIS — F17210 Nicotine dependence, cigarettes, uncomplicated: Secondary | ICD-10-CM | POA: Diagnosis present

## 2018-04-30 DIAGNOSIS — O99310 Alcohol use complicating pregnancy, unspecified trimester: Secondary | ICD-10-CM

## 2018-04-30 DIAGNOSIS — Z886 Allergy status to analgesic agent status: Secondary | ICD-10-CM | POA: Diagnosis not present

## 2018-04-30 DIAGNOSIS — D649 Anemia, unspecified: Secondary | ICD-10-CM | POA: Diagnosis present

## 2018-04-30 DIAGNOSIS — O0990 Supervision of high risk pregnancy, unspecified, unspecified trimester: Secondary | ICD-10-CM

## 2018-04-30 DIAGNOSIS — O9902 Anemia complicating childbirth: Secondary | ICD-10-CM | POA: Diagnosis present

## 2018-04-30 DIAGNOSIS — O99334 Smoking (tobacco) complicating childbirth: Secondary | ICD-10-CM | POA: Diagnosis present

## 2018-04-30 DIAGNOSIS — F1010 Alcohol abuse, uncomplicated: Secondary | ICD-10-CM | POA: Diagnosis present

## 2018-04-30 DIAGNOSIS — Z3A38 38 weeks gestation of pregnancy: Secondary | ICD-10-CM

## 2018-04-30 DIAGNOSIS — O288 Other abnormal findings on antenatal screening of mother: Secondary | ICD-10-CM | POA: Diagnosis present

## 2018-04-30 LAB — CBC
HCT: 29.3 % — ABNORMAL LOW (ref 36.0–46.0)
Hemoglobin: 9.8 g/dL — ABNORMAL LOW (ref 12.0–15.0)
MCH: 32.9 pg (ref 26.0–34.0)
MCHC: 33.4 g/dL (ref 30.0–36.0)
MCV: 98.3 fL (ref 80.0–100.0)
Platelets: 292 10*3/uL (ref 150–400)
RBC: 2.98 MIL/uL — ABNORMAL LOW (ref 3.87–5.11)
RDW: 12.9 % (ref 11.5–15.5)
WBC: 8.5 10*3/uL (ref 4.0–10.5)
nRBC: 0 % (ref 0.0–0.2)

## 2018-04-30 LAB — TYPE AND SCREEN
ABO/RH(D): O POS
Antibody Screen: NEGATIVE

## 2018-04-30 MED ORDER — EPHEDRINE 5 MG/ML INJ
10.0000 mg | INTRAVENOUS | Status: DC | PRN
  Filled 2018-04-30: qty 2

## 2018-04-30 MED ORDER — LACTATED RINGERS IV SOLN: 500.0000 mL | Freq: Once | INTRAVENOUS | Status: DC

## 2018-04-30 MED ORDER — LIDOCAINE HCL (PF) 1 % IJ SOLN
30.0000 mL | INTRAMUSCULAR | Status: AC | PRN
  Administered 2018-04-30: 30 mL via SUBCUTANEOUS
  Filled 2018-04-30: qty 30

## 2018-04-30 MED ORDER — DIBUCAINE 1 % RE OINT: 1.0000 "application " | TOPICAL_OINTMENT | RECTAL | Status: DC | PRN

## 2018-04-30 MED ORDER — WITCH HAZEL-GLYCERIN EX PADS: 1.0000 "application " | MEDICATED_PAD | CUTANEOUS | Status: DC | PRN

## 2018-04-30 MED ORDER — MEASLES, MUMPS & RUBELLA VAC IJ SOLR
0.5000 mL | Freq: Once | INTRAMUSCULAR | Status: DC
  Filled 2018-04-30: qty 0.5

## 2018-04-30 MED ORDER — ONDANSETRON HCL 4 MG PO TABS: 4.0000 mg | ORAL_TABLET | ORAL | Status: DC | PRN

## 2018-04-30 MED ORDER — ONDANSETRON HCL 4 MG/2ML IJ SOLN: 4.0000 mg | Freq: Four times a day (QID) | INTRAMUSCULAR | Status: DC | PRN

## 2018-04-30 MED ORDER — DIPHENHYDRAMINE HCL 25 MG PO CAPS: 25.0000 mg | ORAL_CAPSULE | Freq: Four times a day (QID) | ORAL | Status: DC | PRN

## 2018-04-30 MED ORDER — COCONUT OIL OIL: 1.0000 "application " | TOPICAL_OIL | Status: DC | PRN

## 2018-04-30 MED ORDER — FENTANYL CITRATE (PF) 100 MCG/2ML IJ SOLN: 100.0000 ug | INTRAMUSCULAR | Status: DC | PRN

## 2018-04-30 MED ORDER — SIMETHICONE 80 MG PO CHEW: 80.0000 mg | CHEWABLE_TABLET | ORAL | Status: DC | PRN

## 2018-04-30 MED ORDER — FENTANYL-BUPIVACAINE-NACL 0.5-0.125-0.9 MG/250ML-% EP SOLN
12.0000 mL/h | EPIDURAL | Status: DC | PRN
  Administered 2018-04-30: 12 mL/h via EPIDURAL

## 2018-04-30 MED ORDER — ONDANSETRON HCL 4 MG/2ML IJ SOLN: 4.0000 mg | INTRAMUSCULAR | Status: DC | PRN

## 2018-04-30 MED ORDER — ACETAMINOPHEN 325 MG PO TABS: 650.0000 mg | ORAL_TABLET | ORAL | Status: DC | PRN

## 2018-04-30 MED ORDER — LIDOCAINE HCL (PF) 1 % IJ SOLN
INTRAMUSCULAR | Status: DC | PRN
  Administered 2018-04-30: 3 mL via EPIDURAL
  Administered 2018-04-30: 2 mL via EPIDURAL
  Administered 2018-04-30: 5 mL via EPIDURAL

## 2018-04-30 MED ORDER — FENTANYL 2.5 MCG/ML BUPIVACAINE 1/10 % EPIDURAL INFUSION (WH - ANES): INTRAMUSCULAR | Status: DC | PRN

## 2018-04-30 MED ORDER — PRENATAL MULTIVITAMIN CH
1.0000 | ORAL_TABLET | Freq: Every day | ORAL | Status: DC
  Administered 2018-05-01 – 2018-05-02 (×2): 1 via ORAL
  Filled 2018-04-30 (×2): qty 1

## 2018-04-30 MED ORDER — FENTANYL-BUPIVACAINE-NACL 0.5-0.125-0.9 MG/250ML-% EP SOLN
12.0000 mL/h | EPIDURAL | Status: DC | PRN
  Filled 2018-04-30: qty 250

## 2018-04-30 MED ORDER — LACTATED RINGERS IV SOLN: 500.0000 mL | INTRAVENOUS | Status: DC | PRN

## 2018-04-30 MED ORDER — SODIUM CHLORIDE 0.9 % IV SOLN
2.0000 g | Freq: Four times a day (QID) | INTRAVENOUS | Status: DC
  Administered 2018-04-30: 2 g via INTRAVENOUS
  Filled 2018-04-30: qty 2
  Filled 2018-04-30: qty 2000

## 2018-04-30 MED ORDER — OXYTOCIN BOLUS FROM INFUSION
500.0000 mL | Freq: Once | INTRAVENOUS | Status: AC
  Administered 2018-04-30: 500 mL via INTRAVENOUS

## 2018-04-30 MED ORDER — DIPHENHYDRAMINE HCL 50 MG/ML IJ SOLN: 12.5000 mg | INTRAMUSCULAR | Status: DC | PRN

## 2018-04-30 MED ORDER — OXYTOCIN 40 UNITS IN NORMAL SALINE INFUSION - SIMPLE MED
2.5000 [IU]/h | INTRAVENOUS | Status: DC
  Administered 2018-04-30: 2.5 [IU]/h via INTRAVENOUS
  Filled 2018-04-30: qty 1000

## 2018-04-30 MED ORDER — SOD CITRATE-CITRIC ACID 500-334 MG/5ML PO SOLN
30.0000 mL | ORAL | Status: DC | PRN
  Filled 2018-04-30: qty 30

## 2018-04-30 MED ORDER — PHENYLEPHRINE 40 MCG/ML (10ML) SYRINGE FOR IV PUSH (FOR BLOOD PRESSURE SUPPORT)
80.0000 ug | PREFILLED_SYRINGE | INTRAVENOUS | Status: DC | PRN
  Filled 2018-04-30 (×2): qty 10

## 2018-04-30 MED ORDER — SENNOSIDES-DOCUSATE SODIUM 8.6-50 MG PO TABS
2.0000 | ORAL_TABLET | ORAL | Status: DC
  Administered 2018-05-01 (×2): 2 via ORAL
  Filled 2018-04-30 (×2): qty 2

## 2018-04-30 MED ORDER — PHENYLEPHRINE 40 MCG/ML (10ML) SYRINGE FOR IV PUSH (FOR BLOOD PRESSURE SUPPORT)
80.0000 ug | PREFILLED_SYRINGE | INTRAVENOUS | Status: DC | PRN
  Filled 2018-04-30: qty 10

## 2018-04-30 MED ORDER — BENZOCAINE-MENTHOL 20-0.5 % EX AERO: 1.0000 "application " | INHALATION_SPRAY | CUTANEOUS | Status: DC | PRN

## 2018-04-30 MED ORDER — TETANUS-DIPHTH-ACELL PERTUSSIS 5-2.5-18.5 LF-MCG/0.5 IM SUSP: 0.5000 mL | Freq: Once | INTRAMUSCULAR | Status: DC

## 2018-04-30 MED ORDER — IBUPROFEN 600 MG PO TABS
600.0000 mg | ORAL_TABLET | Freq: Four times a day (QID) | ORAL | Status: DC
  Administered 2018-05-01 – 2018-05-02 (×7): 600 mg via ORAL
  Filled 2018-04-30 (×7): qty 1

## 2018-04-30 MED ORDER — LACTATED RINGERS IV SOLN
INTRAVENOUS | Status: DC
  Administered 2018-04-30 (×2): via INTRAVENOUS

## 2018-04-30 NOTE — Discharge Summary (Addendum)
Postpartum Discharge Summary     Patient Name: Terri Herrera DOB: Jul 31, 1990 MRN: [id number]  Date of admission: 04/30/2018 Delivering Provider: Gwenevere Abbot   Date of discharge: 05/02/2018  Admitting diagnosis: ems ctx Intrauterine pregnancy: [redacted]w[redacted]d     Secondary diagnosis:  Active Problems:   Alcohol abuse affecting pregnancy   Tobacco abuse   NST (non-stress test) with decelerations  Additional problems: Limited prenatal care, 5 visits total     Discharge diagnosis: Term Pregnancy Delivered                                                                                                Post partum procedures:None  Augmentation: None  Complications: Precipitous delivery   Hospital course:  Onset of Labor With Vaginal Delivery     28 y.o. yo [id number] at [redacted]w[redacted]d was admitted in Active Labor on 04/30/2018. Patient had an uncomplicated labor course as follows:  Membrane Rupture Time/Date: 6:08 PM ,04/30/2018   Intrapartum Procedures: Episiotomy: None [1]                                         Lacerations:  1st degree [2]  Patient had a delivery of a Viable infant. 04/30/2018  Information for the patient's newborn:  Fisher, Wiechmann [[id number]]  Delivery Method: Vaginal, Spontaneous(Filed from Delivery Summary)    Pateint had an uncomplicated postpartum course.  She is ambulating, tolerating a regular diet, passing flatus, and urinating well. Patient is discharged home in stable condition on 05/02/18.   Magnesium Sulfate recieved: No BMZ received: No  Physical exam  Vitals:   05/01/18 0945 05/01/18 1355 05/01/18 2108 05/02/18 0556  BP: 113/66 (!) 105/59 106/73 117/75  Pulse: 74 70 75 71  Resp: 18 16 18 20   Temp: 98.2 F (36.8 C) 98 F (36.7 C) 98.2 F (36.8 C) 98.2 F (36.8 C)  TempSrc: Oral Oral Oral Oral  SpO2: 100% 100%    Height:       General: alert, cooperative and no distress Lochia: appropriate Uterine Fundus: firm Incision: N/A DVT Evaluation: No  evidence of DVT seen on physical exam. Labs: Lab Results  Component Value Date   WBC 8.5 04/30/2018   HGB 9.8 (L) 04/30/2018   HCT 29.3 (L) 04/30/2018   MCV 98.3 04/30/2018   PLT 292 04/30/2018   CMP Latest Ref Rng & Units 05/28/2017  Glucose 65 - 99 mg/dL 352(Y)  BUN 6 - 20 mg/dL 8  Creatinine 8.18 - 5.90 mg/dL 9.31  Sodium 121 - 624 mmol/L 135  Potassium 3.5 - 5.1 mmol/L 3.8  Chloride 101 - 111 mmol/L 101  CO2 22 - 32 mmol/L 23  Calcium 8.9 - 10.3 mg/dL 9.5  Total Protein 6.5 - 8.1 g/dL 7.9  Total Bilirubin 0.3 - 1.2 mg/dL 0.7  Alkaline Phos 38 - 126 U/L 21(L)  AST 15 - 41 U/L 32  ALT 14 - 54 U/L 36    Discharge instruction: per After Visit Summary and "Baby and Me  Booklet".  After visit meds:  Allergies as of 05/02/2018      Reactions   Acetaminophen    No reaction; pt prefers not to take      Medication List    TAKE these medications   ibuprofen 800 MG tablet Commonly known as:  ADVIL,MOTRIN Take 1 tablet (800 mg total) by mouth every 8 (eight) hours as needed.   Prenatal Vitamin 27-0.8 MG Tabs Take 1 tablet by mouth at bedtime.       Diet: routine diet  Activity: Advance as tolerated. Pelvic rest for 6 weeks.   Outpatient follow up:4 weeks Follow up Appt: Future Appointments  Date Time Provider Department Center  06/05/2018  2:15 PM Rasch, Harolyn Rutherford, NP [facility]   Follow up Visit: Follow-up Information    [facility].   Specialty:  Obstetrics and Gynecology Contact information: [address] [place] [address] [phone]         Please schedule this patient for Postpartum visit in: 4 weeks with the following provider: Any provider  For C/S patients schedule nurse incision check in weeks 2 weeks: no  Low risk pregnancy complicated by: limited PNC  Delivery mode: SVD  Anticipated Birth Control: Depo  PP Procedures needed: None  Schedule Integrated BH visit: no   Newborn  Data: Live born female  Birth Weight: 5 lb 1.8 oz (2319 g) APGAR: 8, 9  Newborn Delivery   Birth date/time:  04/30/2018 19:19:00 Delivery type:  Vaginal, Spontaneous     Baby Feeding: Bottle Disposition:home with mother   Thressa Sheller DNP, CNM  05/02/18  4:37 PM

## 2018-04-30 NOTE — Anesthesia Procedure Notes (Signed)
Epidural Patient location during procedure: OB Start time: 04/30/2018 6:23 PM End time: 04/30/2018 6:29 PM  Staffing Anesthesiologist: Cecile Hearing, MD Performed: anesthesiologist   Preanesthetic Checklist Completed: patient identified, pre-op evaluation, timeout performed, IV checked, risks and benefits discussed and monitors and equipment checked  Epidural Patient position: sitting Prep: DuraPrep Patient monitoring: blood pressure and continuous pulse ox Approach: midline Location: L3-L4 Injection technique: LOR air  Needle:  Needle type: Tuohy  Needle gauge: 17 G Needle length: 9 cm Needle insertion depth: 5 cm Catheter size: 19 Gauge Catheter at skin depth: 10 cm Test dose: negative and Other (1% Lidocaine)  Additional Notes Patient identified.  Risk benefits discussed including failed block, incomplete pain control, headache, nerve damage, paralysis, blood pressure changes, nausea, vomiting, reactions to medication both toxic or allergic, and postpartum back pain.  Patient expressed understanding and wished to proceed.  All questions were answered.  Sterile technique used throughout procedure and epidural site dressed with sterile barrier dressing. No paresthesia or other complications noted. The patient did not experience any signs of intravascular injection such as tinnitus or metallic taste in mouth nor signs of intrathecal spread such as rapid motor block. Please see nursing notes for vital signs. Reason for block:procedure for pain

## 2018-04-30 NOTE — Anesthesia Preprocedure Evaluation (Signed)
Anesthesia Evaluation  Patient identified by MRN, date of birth, ID band Patient awake    Reviewed: Allergy & Precautions, NPO status , Patient's Chart, lab work & pertinent test results  Airway Mallampati: II  TM Distance: >3 FB Neck ROM: Full    Dental  (+) Dental Advisory Given, Missing,    Pulmonary Current Smoker,    Pulmonary exam normal breath sounds clear to auscultation       Cardiovascular negative cardio ROS Normal cardiovascular exam Rhythm:Regular Rate:Normal     Neuro/Psych negative neurological ROS     GI/Hepatic negative GI ROS, Neg liver ROS, (+)       alcohol use,   Endo/Other  negative endocrine ROS  Renal/GU negative Renal ROS     Musculoskeletal negative musculoskeletal ROS (+)   Abdominal   Peds  Hematology  (+) Blood dyscrasia, anemia , Plt 292k   Anesthesia Other Findings Day of surgery medications reviewed with the patient.  Reproductive/Obstetrics (+) Pregnancy                             Anesthesia Physical Anesthesia Plan  ASA: II  Anesthesia Plan: Epidural   Post-op Pain Management:    Induction:   PONV Risk Score and Plan: 1 and Treatment may vary due to age or medical condition  Airway Management Planned: Natural Airway  Additional Equipment:   Intra-op Plan:   Post-operative Plan:   Informed Consent: I have reviewed the patients History and Physical, chart, labs and discussed the procedure including the risks, benefits and alternatives for the proposed anesthesia with the patient or authorized representative who has indicated his/her understanding and acceptance.     Dental advisory given  Plan Discussed with:   Anesthesia Plan Comments: (Patient identified. Risks/Benefits/Options discussed with patient including but not limited to bleeding, infection, nerve damage, paralysis, failed block, incomplete pain control, headache, blood  pressure changes, nausea, vomiting, reactions to medication both or allergic, itching and postpartum back pain. Confirmed with bedside nurse the patient's most recent platelet count. Confirmed with patient that they are not currently taking any anticoagulation, have any bleeding history or any family history of bleeding disorders. Patient expressed understanding and wished to proceed. All questions were answered. )        Anesthesia Quick Evaluation

## 2018-04-30 NOTE — Progress Notes (Addendum)
Pt OOB and standing at bedside.  FHR tracing sketchy secondary to maternal position.

## 2018-04-30 NOTE — H&P (Signed)
LABOR AND DELIVERY ADMISSION HISTORY AND PHYSICAL NOTE  Terri Herrera is a 28 y.o. female [phone] with IUP at [redacted]w[redacted]d by 19w [place] presenting for labor check.  Patient initially had variables in MAU at term.  BPP ordered, but patient did not get it performed because she was admitted. She progressed quickly and delivered shortly after admission.  She reports positive fetal movement. She denies leakage of fluid or vaginal bleeding.  Prenatal History/Complications: PNC at [facility] Pregnancy complications:  - limited PNC - hx of GBS positive in prior pregnancy - alcohol and tobacco use in pregnancy   Past Medical History: Past Medical History:  Diagnosis Date  . Alcohol use affecting pregnancy   . Bronchitis   . Gonorrhea   . Vaginal Pap smear, abnormal     Past Surgical History: Past Surgical History:  Procedure Laterality Date  . WISDOM TOOTH EXTRACTION      Obstetrical History: OB History    Gravida  5   Para  1   Term  1   Preterm      AB  3   Living  1     SAB  3   TAB      Ectopic      Multiple  0   Live Births  1        Obstetric Comments  Admitted ETOH daily and tobacco use during pregnancy        Social History: Social History   Socioeconomic History  . Marital status: Single    Spouse name: Not on file  . Number of children: Not on file  . Years of education: Not on file  . Highest education level: Not on file  Occupational History  . Not on file  Social Needs  . Financial resource strain: Not on file  . Food insecurity:    Worry: Never true    Inability: Never true  . Transportation needs:    Medical: No    Non-medical: No  Tobacco Use  . Smoking status: Light Tobacco Smoker    Packs/day: 0.25    Types: Cigarettes  . Smokeless tobacco: Never Used  Substance and Sexual Activity  . Alcohol use: Not Currently  . Drug use: Not Currently  . Sexual activity: Yes    Birth control/protection: None    Comment: pregnant  Lifestyle  .  Physical activity:    Days per week: Not on file    Minutes per session: Not on file  . Stress: Not on file  Relationships  . Social connections:    Talks on phone: Not on file    Gets together: Not on file    Attends religious service: Not on file    Active member of club or organization: Not on file    Attends meetings of clubs or organizations: Not on file    Relationship status: Not on file  Other Topics Concern  . Not on file  Social History Narrative  . Not on file    Family History: Family History  Problem Relation Age of Onset  . Cancer Paternal Grandmother   . Asthma Mother   . Diabetes Mother   . Alcohol abuse Mother   . Cancer Father   . Cancer Paternal Uncle     Allergies: Allergies  Allergen Reactions  . Acetaminophen     No reaction; pt prefers not to take    Medications Prior to Admission  Medication Sig Dispense Refill Last Dose  . iron polysaccharides (  NIFEREX) 150 MG capsule Take 1 capsule (150 mg total) by mouth daily. 30 capsule 2 Taking  . Prenatal Vit-Fe Fumarate-FA (PRENATAL VITAMIN) 27-0.8 MG TABS Take 1 tablet by mouth at bedtime. 30 tablet 4 Taking     Review of Systems  All systems reviewed and negative except as stated in HPI  Physical Exam Blood pressure 124/74, pulse 73, temperature 98.2 F (36.8 C), temperature source Oral, resp. rate 17, height 5' (1.524 m), last menstrual period 07/16/2017, SpO2 100 %. General appearance: alert, oriented, distress due to pain with contractions Lungs: normal respiratory effort Heart: regular rate Abdomen: soft, non-tender; gravid, FH appropriate for GA Extremities: No calf swelling or tenderness Presentation: cephalic Fetal monitoring: 145/mod var/+ acels/variable decels Uterine activity: reg contractions Dilation: 10 Effacement (%): 100 Station: Plus 1 Exam by:: Dr Loistine Chance  Prenatal labs: ABO, Rh: --/--/O POS (02/19 1746) Antibody: NEG (02/19 1746) Rubella: Immune (08/19 0000) RPR: Non  Reactive (12/20 1232)  HBsAg: Negative (08/19 0000)  HIV: Non Reactive (12/20 1232)  GC/Chlamydia: negative GBS:   unknown 1-hr GTT: 82 Genetic screening:  normal Anatomy US: normal  Prenatal Transfer Tool  Maternal Diabetes: No Genetic Screening: Normal Maternal Ultrasounds/Referrals: Normal Fetal Ultrasounds or other Referrals:  None Maternal Substance Abuse:  Yes:  Type: Smoker, Other: alcohol Significant Maternal Medications:  None Significant Maternal Lab Results: None  Results for orders placed or performed during the hospital encounter of 04/30/18 (from the past 24 hour(s))  CBC   Collection Time: 04/30/18  5:46 PM  Result Value Ref Range   WBC 8.5 4.0 - 10.5 K/uL   RBC 2.98 (L) 3.87 - 5.11 MIL/uL   Hemoglobin 9.8 (L) 12.0 - 15.0 g/dL   HCT 62.3 (L) 76.2 - 83.1 %   MCV 98.3 80.0 - 100.0 fL   MCH 32.9 26.0 - 34.0 pg   MCHC 33.4 30.0 - 36.0 g/dL   RDW 51.7 61.6 - 07.3 %   Platelets 292 150 - 400 K/uL   nRBC 0.0 0.0 - 0.2 %  Type and screen [facility]   Collection Time: 04/30/18  5:46 PM  Result Value Ref Range   ABO/RH(D) O POS    Antibody Screen NEG    Sample Expiration      05/03/2018 Performed at [facility], [address], [place], [place] [address]     Patient Active Problem List   Diagnosis Date Noted  . NST (non-stress test) with decelerations 04/30/2018  . Alcohol abuse affecting pregnancy 03/28/2018  . Tobacco abuse 03/28/2018  . Supervision of high risk pregnancy, antepartum 11/15/2017  . History of prior pregnancy with SGA newborn 11/15/2017  . Foot pain, right 06/04/2016  . Plantar wart of right foot 06/04/2016    Assessment: Terri Herrera is a 28 y.o. G5P1031 at [redacted]w[redacted]d here for labor.  #Labor: presented in early labor and progressed quickly to delivery  #Pain: Epidural placed just before delivery  #FWB: Cat 2 #ID:  GBS unknown. Positive in prior pregnancy. Treated with ampicillin x 1 at delivery  #MOF:  Bottle #MOC:Depo #Circ:  NA #tobacco and alcohol use: SW consult  Gwenevere Abbot, MD  04/30/2018, 8:41 PM

## 2018-04-30 NOTE — MAU Note (Signed)
Presents with ctxs that have progressively gotten worse throughout the day.  Denies LOF or VB.  Reports +FM.

## 2018-05-01 ENCOUNTER — Encounter (HOSPITAL_COMMUNITY): Payer: Self-pay

## 2018-05-01 LAB — RPR: RPR Ser Ql: NONREACTIVE

## 2018-05-01 MED ORDER — OXYCODONE HCL 5 MG PO TABS
5.0000 mg | ORAL_TABLET | Freq: Four times a day (QID) | ORAL | Status: DC | PRN
  Administered 2018-05-01 (×2): 5 mg via ORAL
  Filled 2018-05-01 (×2): qty 1

## 2018-05-01 NOTE — Clinical Social Work Maternal (Signed)
CLINICAL SOCIAL WORK MATERNAL/CHILD NOTE  Patient Details  Name: Terri Herrera MRN: [id number] Date of Birth: 04/09/1990  Date:  05/01/2018  Clinical Social Worker Initiating Note:  Abundio Miu, [place] Date/Time: Initiated:  05/01/18/1326     Child's Name:  Terri Herrera   Biological Parents:  Mother, Father(Father- Terri Herrera)   Need for Interpreter:  None   Reason for Referral:  Current Substance Use/Substance Use During Pregnancy (Tobacco and Alcohol use during pregnancy)   Address:  [place] [address]    Phone number:  [phone] (home)     Additional phone number:   Household Members/Support Persons (HM/SP):   Household Member/Support Person 1, Household Member/Support Person 2, Household Member/Support Person 3, Household Member/Support Person 4   HM/SP Name Relationship DOB or Age  HM/SP -68   mom    HM/SP -2 Kyriana Yankee daughter 05/24/14  HM/SP -3   nephew    HM/SP -4   nephew    HM/SP -5        HM/SP -6        HM/SP -7        HM/SP -8          Natural Supports (not living in the home):  Extended Family(brothers)   Professional Supports: None   Employment: Unemployed   Type of Work:     Education:  Other (comment)(GED online)   Homebound arranged:    Museum/gallery curator Resources:  Medicaid   Other Resources:  Physicist, medical (Plans to apply for [facility])   Cultural/Religious Considerations Which May Impact Care:    Strengths:  Ability to meet basic needs , Home prepared for child , Pediatrician chosen   Psychotropic Medications:         Pediatrician:    [facility] area  Pediatrician List:   [place]      Pediatrician Fax Number:    Risk Factors/Current Problems:  Substance Use    Cognitive State:  Able to Concentrate , Alert , Linear Thinking , Goal Oriented    Mood/Affect:  Calm  , Happy , Interested    CSW Assessment: CSW met with MOB at bedside to discuss consult for tobacco and alcohol use during pregnancy, FOB present. CSW asked FOB to leave during assessment with MOB's permission, FOB left voluntarily. CSW introduced self and explained reason for consult. MOB was engaged and open during assessment. MOB reported that she resides with her mother, daughter and nephews. MOB reported that she is currently unemployed and plans to get a job once she is able. MOB reported that she receives food stamps and plans to apply for [facility]. MOB reported that she has everything needed to care for infant. CSW inquired about MOB's support system, MOB reported that her mother and brothers were her supports.   CSW inquired about MOB's mental health history, MOB denied any mental health history and denied a history of postpartum depression. MOB presented calm and was forthcoming during assessment. MOB did not demonstrate any acute mental health signs/symptoms. CSW assessed for safety, MOB denied SI, HI and domestic violence.  CSW provided education regarding the baby blues period vs. perinatal mood disorders, discussed treatment and gave resources for mental health follow up if concerns arise.  CSW recommends self-evaluation during the postpartum time period using the New Mom Checklist from Postpartum Progress and encouraged MOB to  contact a medical professional if symptoms are noted at any time.    CSW inquired about MOB's alcohol use during pregnancy, MOB reported that she drank beers until she found out about her pregnancy. MOB reported that after finding out she was pregnant she stopped drinking beers and started drinking wine. MOB reported that her pregnancy was unplanned and at first she didn't believe she was pregnant. MOB reported that she had originally planned to abort her pregnancy but was unable to afford it. CSW asked MOB if she felt attached and bonded with her baby, MOB reported yes and  she also felt that way during pregnancy. MOB reported that her doctor said it was okay to drink wine. CSW asked MOB how often/how much she was consuming. MOB reported that she only drank whenever she could afford it, noting that wine is expensive. CSW asked MOB if she felt like her alcohol use was an issue, MOB reported no.   CSW identifies no further need for intervention and no barriers to discharge at this time.  CSW Plan/Description:  No Further Intervention Required/No Barriers to Discharge, Perinatal Mood and Anxiety Disorder (PMADs) Education    Burnis Medin, LCSW 05/01/2018, 1:29 PM

## 2018-05-01 NOTE — Anesthesia Postprocedure Evaluation (Signed)
Anesthesia Post Note  Patient: Terri Herrera  Procedure(s) Performed: AN AD HOC LABOR EPIDURAL     Patient location during evaluation: Mother Baby Anesthesia Type: Epidural Level of consciousness: awake, awake and alert and oriented Pain management: pain level controlled Vital Signs Assessment: post-procedure vital signs reviewed and stable Respiratory status: spontaneous breathing Cardiovascular status: blood pressure returned to baseline Postop Assessment: no headache, no backache, epidural receding, patient able to bend at knees, no apparent nausea or vomiting, adequate PO intake and able to ambulate Anesthetic complications: no    Last Vitals:  Vitals:   05/01/18 0216 05/01/18 0619  BP: 109/73 112/68  Pulse: (!) 56 66  Resp: 16 17  Temp: 36.8 C 36.7 C  SpO2: 100% 100%    Last Pain:  Vitals:   05/01/18 0619  TempSrc: Oral  PainSc:    Pain Goal:                   Cleda Clarks

## 2018-05-01 NOTE — Progress Notes (Signed)
Post Partum Day 1 Subjective: No complaints this morning. States had a good night, was able to get some sleep. Bottle feeding. States pain from cramping, ibuprofen only helps for a few minutes. Reports allergy to Tylenol.   Objective: Blood pressure 112/68, pulse 66, temperature 98.1 F (36.7 C), temperature source Oral, resp. rate 17, height 5' (1.524 m), last menstrual period 07/16/2017, SpO2 100 %, unknown if currently breastfeeding.  Physical Exam:  General: alert, well-appearing, NAD Lochia: appropriate Uterine Fundus: firm  abdomen otherwise soft, non-tender Incision: n/a DVT Evaluation: No significant calf/ankle edema.  Recent Labs    04/30/18 1746  HGB 9.8*  HCT 29.3*    Assessment/Plan: Plan for discharge tomorrow  Will order oxycodone for prn use, discussed not available for Rx at discharge SW consult today for alcohol and tobacco use this pregnancy    LOS: 1 day   Julie Nay S Izadora Roehr, DO 05/01/2018, 9:11 AM

## 2018-05-02 MED ORDER — IBUPROFEN 800 MG PO TABS: 800.0000 mg | ORAL_TABLET | Freq: Three times a day (TID) | ORAL | 0 refills | Status: DC | PRN

## 2018-05-02 MED ORDER — MEDROXYPROGESTERONE ACETATE 150 MG/ML IM SUSP
150.0000 mg | Freq: Once | INTRAMUSCULAR | Status: AC
  Administered 2018-05-02: 150 mg via INTRAMUSCULAR
  Filled 2018-05-02: qty 1

## 2018-05-02 NOTE — Discharge Instructions (Signed)

## 2018-06-05 ENCOUNTER — Ambulatory Visit: Payer: Medicaid Other | Admitting: Obstetrics and Gynecology

## 2018-08-05 ENCOUNTER — Ambulatory Visit: Payer: Medicaid Other | Admitting: Obstetrics and Gynecology

## 2019-06-08 IMAGING — CT CT NECK W/ CM
4 of 5 series · 16 of 33 positions shown, 18 images · IV contrast (APPLIED)
Comparison: 05/29/2013 CT cervical spine.  04/25/2011 CT neck.

CLINICAL DATA: 25 y/o F; trismus, asymmetric tonsillar hypertrophy.

EXAM:
CT NECK WITH CONTRAST
TECHNIQUE: Multidetector CT imaging of the neck was performed using the
standard protocol following the bolus administration of intravenous
contrast.
CONTRAST:  75mL 5T2NQO-LNN IOPAMIDOL (5T2NQO-LNN) INJECTION 61%

[Series 3: neck 2.0 i31s 3 · axial · 0.48mm/px · z∈[-246,-114]mm · 4 of 111 slices shown, 5 images]
[im 23/111  soft-tissue]
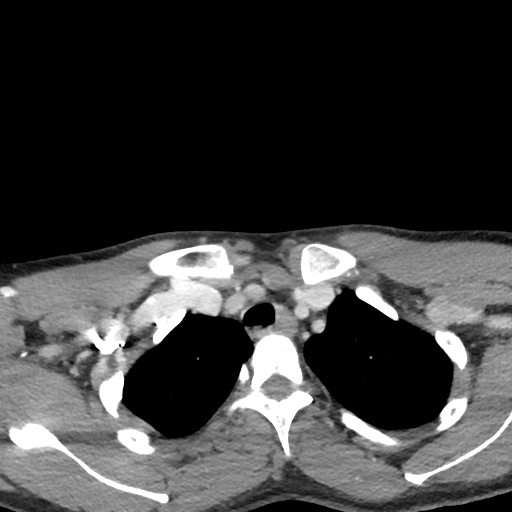
[im 23/111  bone]
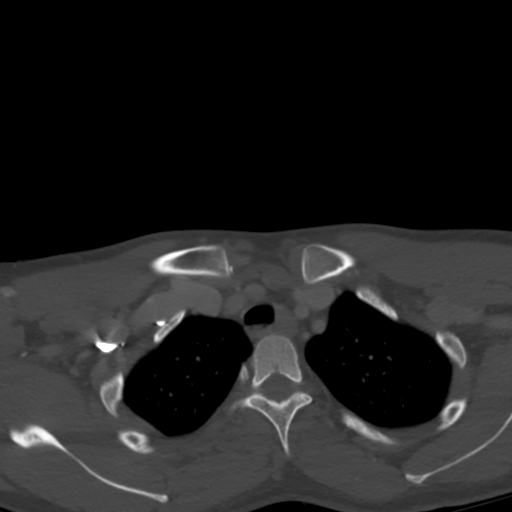
[im 45/111  bone]
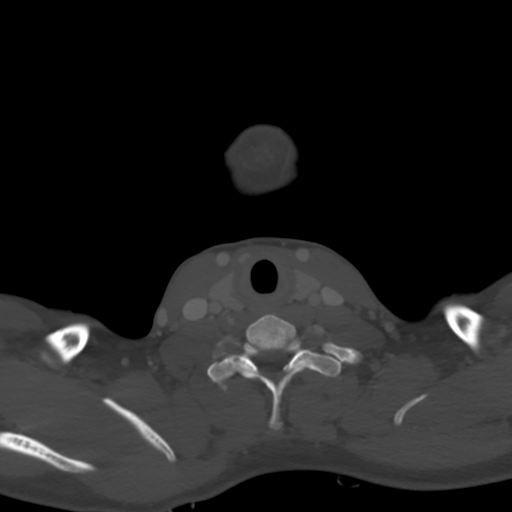
[im 67/111  bone]
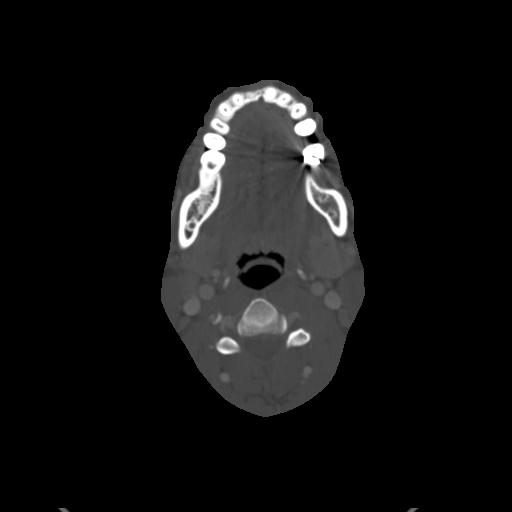
[im 89/111  bone]
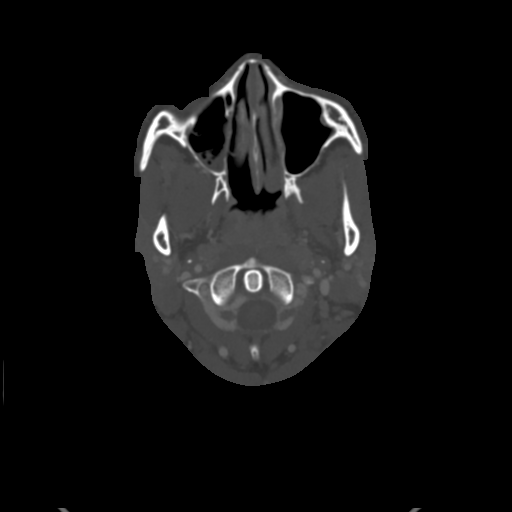

[Series 6: coronal st · coronal · 0.44mm/px · 3 of 113 slices shown]
[im 23/113  bone]
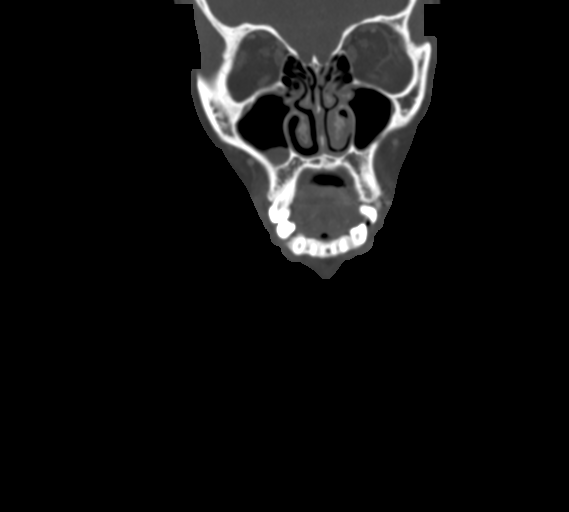
[im 45/113  bone]
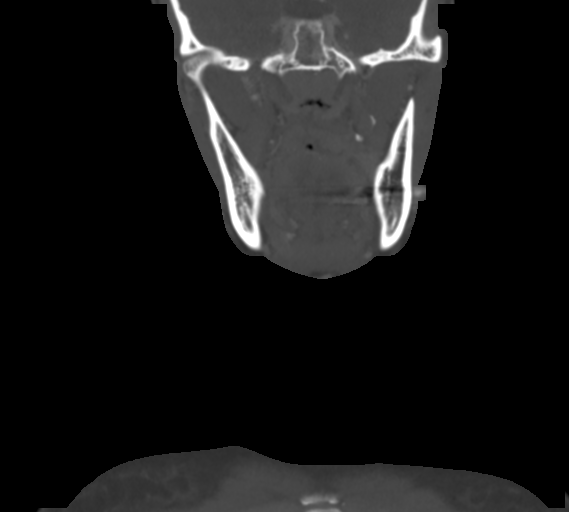
[im 68/113  bone]
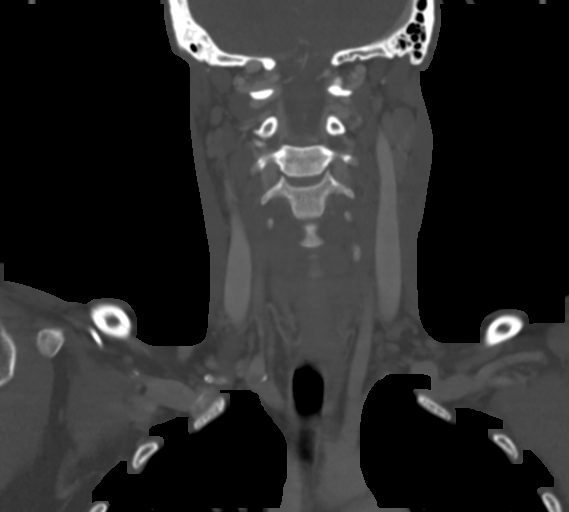

[Series 7: sagittal st · sagittal · 0.46mm/px · 5 of 101 slices shown, 6 images]
[im 34/101  bone]
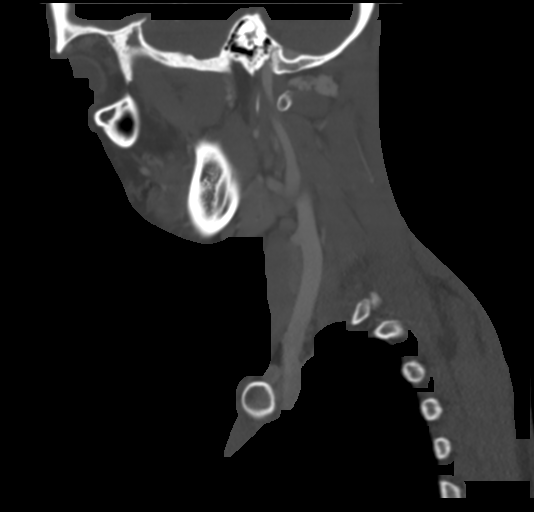
[im 42/101  bone]
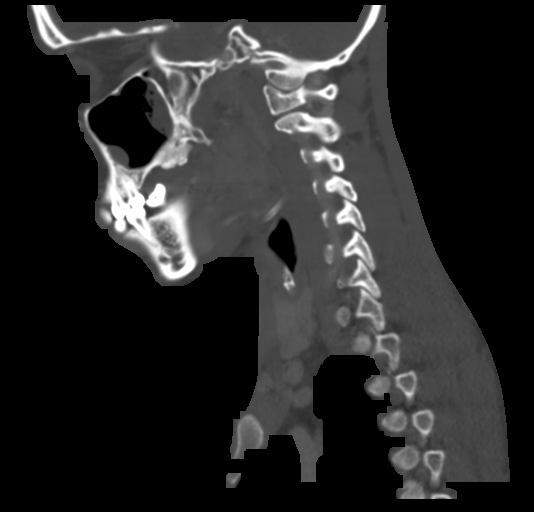
[im 51/101  soft-tissue]
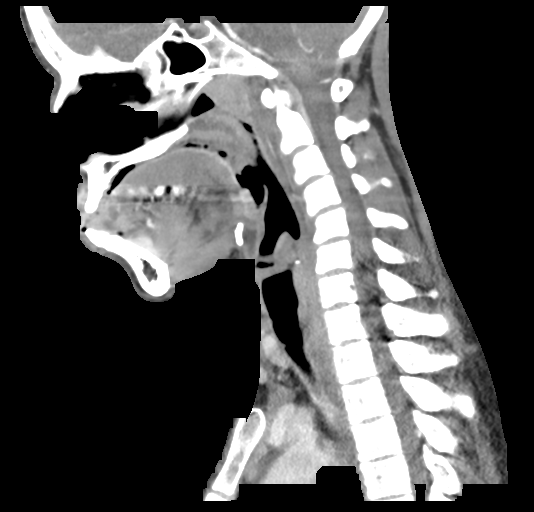
[im 51/101  bone]
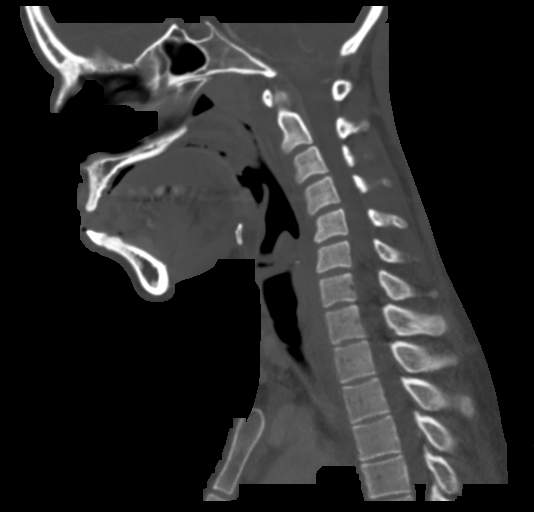
[im 59/101  bone]
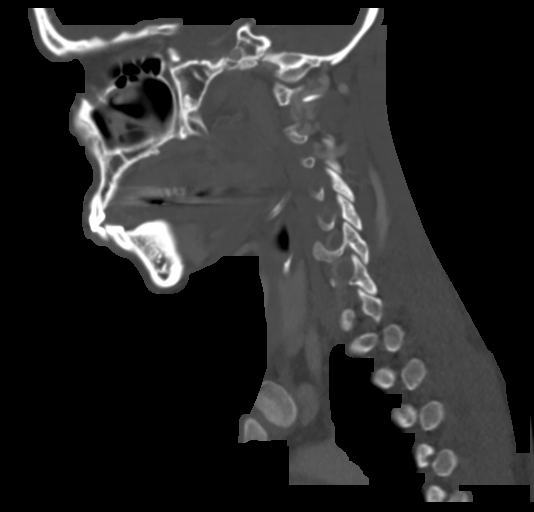
[im 67/101  bone]
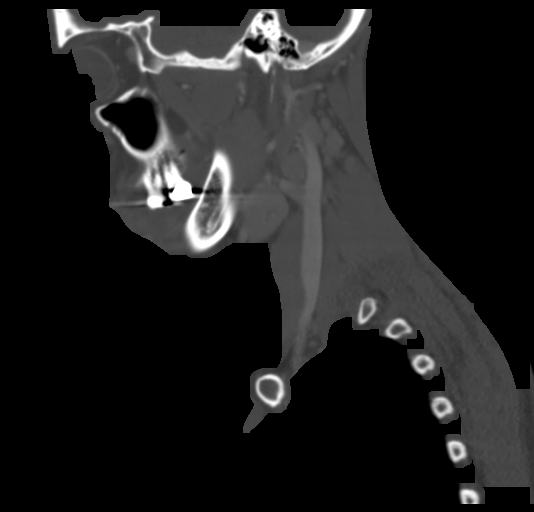

[Series 8: orthogonal st · axial · 0.39mm/px · z∈[-252,-118]mm · 4 of 113 slices shown]
[im 23/113  bone]
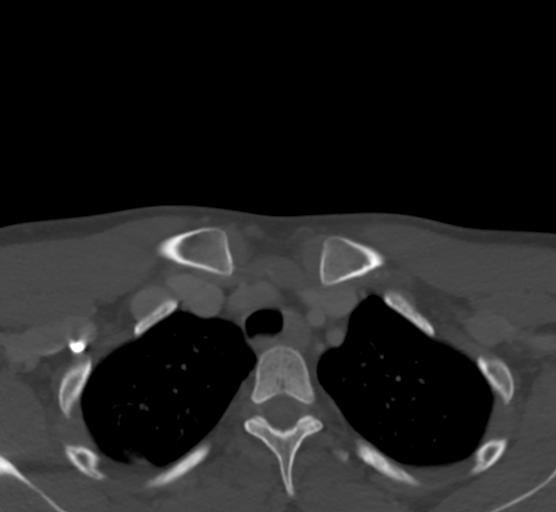
[im 45/113  bone]
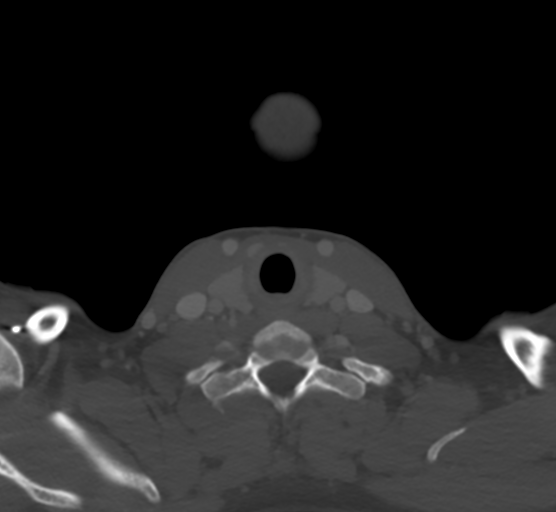
[im 68/113  bone]
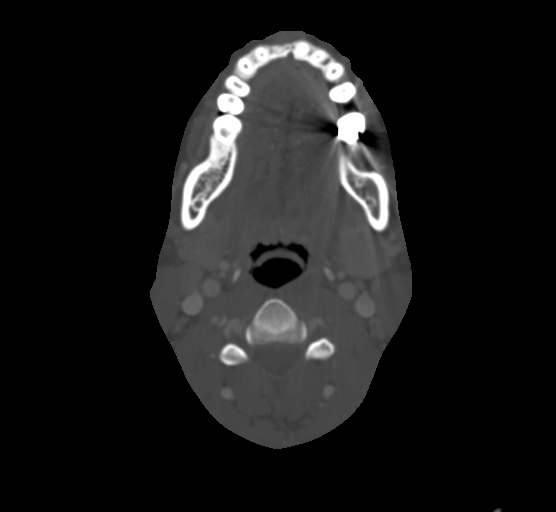
[im 90/113  bone]
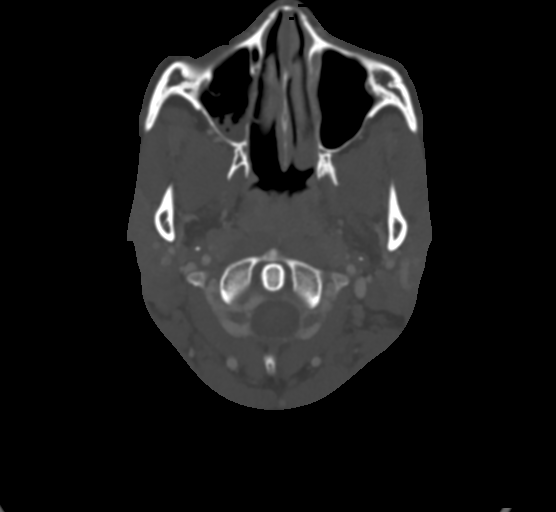

[16 of 33 positions shown; findings below may reference images not displayed]

FINDINGS: Pharynx and larynx: Left palatine tonsillar fluid collection
measuring 14 x 12 x 17 mm (AP x ML x CC series 3: Image 33, [DATE]).
Right tonsillar fluid collection measuring 7 x 6 x 9 mm ([DATE],
[DATE]). Adenoid and palatine tonsillar enlargement. Fat stranding
within the left-greater-than-right parapharyngeal spaces likely
representing reactive edema.

Salivary glands: No inflammation, mass, or stone.

Thyroid: Normal.

Lymph nodes: Prominent upper cervical lymph nodes are likely
reactive.

Vascular: Negative.

Limited intracranial: Negative.

Visualized orbits: Negative.

Mastoids and visualized paranasal sinuses: Clear.

Skeleton: No acute or aggressive process.

Upper chest: Negative.

Other: None.
IMPRESSION: Bilateral palatine tonsil fluid collections likely representing
tonsillar abscesses measuring up to 17 mm on the left and 9 mm on
the right. Mild reactive edema in the left-greater-than-right
parapharyngeal spaces.

By: Twinkle Nadeau M.D.

## 2019-07-28 ENCOUNTER — Emergency Department (HOSPITAL_COMMUNITY)
Admission: EM | Admit: 2019-07-28 | Discharge: 2019-07-28 | Disposition: A | Discharge status: Home / Self Care | Payer: Medicaid Other | Attending: Emergency Medicine | Admitting: Emergency Medicine

## 2019-07-28 ENCOUNTER — Emergency Department (HOSPITAL_COMMUNITY): Payer: Medicaid Other

## 2019-07-28 ENCOUNTER — Encounter (HOSPITAL_COMMUNITY): Payer: Self-pay | Admitting: Emergency Medicine

## 2019-07-28 ENCOUNTER — Other Ambulatory Visit: Payer: Self-pay

## 2019-07-28 DIAGNOSIS — Y929 Unspecified place or not applicable: Secondary | ICD-10-CM | POA: Diagnosis not present

## 2019-07-28 DIAGNOSIS — Y999 Unspecified external cause status: Secondary | ICD-10-CM | POA: Insufficient documentation

## 2019-07-28 DIAGNOSIS — S82024A Nondisplaced longitudinal fracture of right patella, initial encounter for closed fracture: Secondary | ICD-10-CM | POA: Diagnosis not present

## 2019-07-28 DIAGNOSIS — Y939 Activity, unspecified: Secondary | ICD-10-CM | POA: Insufficient documentation

## 2019-07-28 DIAGNOSIS — S8991XA Unspecified injury of right lower leg, initial encounter: Secondary | ICD-10-CM | POA: Diagnosis present

## 2019-07-28 DIAGNOSIS — Z720 Tobacco use: Secondary | ICD-10-CM | POA: Insufficient documentation

## 2019-07-28 DIAGNOSIS — W1830XA Fall on same level, unspecified, initial encounter: Secondary | ICD-10-CM | POA: Insufficient documentation

## 2019-07-28 MED ORDER — IBUPROFEN 400 MG PO TABS
600.0000 mg | ORAL_TABLET | Freq: Once | ORAL | Status: AC
  Administered 2019-07-28: 600 mg via ORAL
  Filled 2019-07-28: qty 1

## 2019-07-28 MED ORDER — OXYCODONE HCL 5 MG PO TABS
5.0000 mg | ORAL_TABLET | Freq: Once | ORAL | Status: AC
  Administered 2019-07-28: 5 mg via ORAL
  Filled 2019-07-28: qty 1

## 2019-07-28 MED ORDER — OXYCODONE HCL 5 MG PO TABS: 5.0000 mg | ORAL_TABLET | Freq: Four times a day (QID) | ORAL | 0 refills | Status: DC | PRN

## 2019-07-28 NOTE — Discharge Instructions (Signed)
You may put weight on your leg when you have the knee immobilizer on.   Please take Ibuprofen (Advil, motrin) and Tylenol (acetaminophen) to relieve your pain.  You may take up to 600 MG (3 pills) of normal strength ibuprofen every 6 hours as needed.  Please take this with food.    Today you received medications that may make you sleepy or impair your ability to make decisions.  For the next 24 hours please do not drive, operate heavy machinery, care for a small child with out another adult present, or perform any activities that may cause harm to you or someone else if you were to fall asleep or be impaired.   You are being prescribed a medication which may make you sleepy. Please follow up of listed precautions for at least 24 hours after taking one dose.

## 2019-07-28 NOTE — ED Triage Notes (Signed)
Pt arrives via EMS after slipping on the floor last night and landing on right knee. Pt took valium about an hour ago with no relief.

## 2019-07-28 NOTE — Care Management (Signed)
Patient transported home by [facility] transportation department [employer] wavier signed and place in patient's record.

## 2019-07-28 NOTE — Progress Notes (Signed)
Orthopedic Tech Progress Note Patient Details:  Terri Herrera 1990-09-05 [id number]  Ortho Devices Type of Ortho Device: Ace wrap, Knee Immobilizer, Crutches Ortho Device/Splint Location: rle knee ace wrap and knee immobilizer. i assisted kyra with application. Ortho Device/Splint Interventions: Ordered, Application, Adjustment   Post Interventions Patient Tolerated: Well Instructions Provided: Care of device, Adjustment of device   [name] 07/28/2019, 11:08 PM

## 2019-07-28 NOTE — ED Notes (Signed)
Patient verbalizes understanding of discharge instructions. Opportunity for questioning and answers were provided. Armband removed by staff, pt discharged from ED ambulatory to [facility]

## 2019-07-28 NOTE — ED Provider Notes (Signed)
[facility] EMERGENCY DEPARTMENT Provider Note   CSN: [id number] Arrival date & time: 07/28/19  1742     History Chief Complaint  Patient presents with  . Knee Injury    Terri Herrera is a 29 y.o. female with no current past medical history presents today for evaluation of pain right knee.  She reports that yesterday afternoon she had a mechanical, nonsyncopal slip and fall landing directly on her right knee.  She denies striking her head or passing out.  No other injuries.  She reports that she has been unable to bear weight since this happened.  She has never injured this knee before.  She denies any pain in the remainder of her leg.  No weakness or numbness.  HPI     Past Medical History:  Diagnosis Date  . Alcohol use affecting pregnancy   . Bronchitis   . Gonorrhea   . Vaginal Pap smear, abnormal     Patient Active Problem List   Diagnosis Date Noted  . NST (non-stress test) with decelerations 04/30/2018  . Alcohol abuse affecting pregnancy 03/28/2018  . Tobacco abuse 03/28/2018  . Supervision of high risk pregnancy, antepartum 11/15/2017  . History of prior pregnancy with SGA newborn 11/15/2017  . Foot pain, right 06/04/2016  . Plantar wart of right foot 06/04/2016    Past Surgical History:  Procedure Laterality Date  . WISDOM TOOTH EXTRACTION       OB History    Gravida  5   Para  2   Term  2   Preterm      AB  3   Living  2     SAB  3   TAB      Ectopic      Multiple  0   Live Births  2        Obstetric Comments  Admitted ETOH daily and tobacco use during pregnancy        Family History  Problem Relation Age of Onset  . Cancer Paternal Grandmother   . Asthma Mother   . Diabetes Mother   . Alcohol abuse Mother   . Cancer Father   . Cancer Paternal Uncle     Social History   Tobacco Use  . Smoking status: Light Tobacco Smoker    Packs/day: 0.25    Types: Cigarettes  . Smokeless tobacco: Never Used    Substance Use Topics  . Alcohol use: Not Currently  . Drug use: Not Currently    Home Medications Prior to Admission medications   Medication Sig Start Date End Date Taking? Authorizing Provider  ibuprofen (ADVIL,MOTRIN) 800 MG tablet Take 1 tablet (800 mg total) by mouth every 8 (eight) hours as needed. Patient not taking: Reported on 07/28/2019 05/02/18   Marcille Buffy D, CNM  oxyCODONE (ROXICODONE) 5 MG immediate release tablet Take 1 tablet (5 mg total) by mouth every 6 (six) hours as needed for severe pain. 07/28/19   Lorin Glass, PA-C  Prenatal Vit-Fe Fumarate-FA (PRENATAL VITAMIN) 27-0.8 MG TABS Take 1 tablet by mouth at bedtime. Patient not taking: Reported on 07/28/2019 12/16/17   Julianne Handler, CNM    Allergies    Acetaminophen  Review of Systems   Review of Systems  Constitutional: Negative for chills and fever.  Respiratory: Negative for shortness of breath.   Musculoskeletal: Positive for joint swelling.       Right knee pain   All other systems reviewed and are negative.  Physical Exam Updated Vital Signs BP 115/83 (BP Location: Right Arm)   Pulse 92   Temp 98.9 F (37.2 C) (Oral)   Resp 18   Ht 5\' 3"  (1.6 m)   Wt 54.4 kg   SpO2 100%   BMI 21.26 kg/m   Physical Exam Vitals and nursing note reviewed.  Constitutional:      General: She is not in acute distress. HENT:     Head: Normocephalic and atraumatic.  Cardiovascular:     Pulses: Normal pulses.     Comments: 2+ right dp/pt pulse Pulmonary:     Effort: Pulmonary effort is normal. No respiratory distress.  Musculoskeletal:     Comments: Right knee with moderate to large joint effusion.  Range of motion is intact to the right ankle with intact strength.  ROM of the knee not tested due to fracture.  No pain on palpation of right ankle or hip.  Skin:    Comments: No lacerations abrasions wounds or other skin breaks present on the right anterior knee.   Neurological:     Mental Status:  She is alert.     Comments: Sensation intact to light touch to right foot.   Psychiatric:        Mood and Affect: Mood normal.     ED Results / Procedures / Treatments   Labs (all labs ordered are listed, but only abnormal results are displayed) Labs Reviewed - No data to display  EKG None  Radiology DG Knee Complete 4 Views Right  Result Date: 07/28/2019 CLINICAL DATA:  Fall onto right knee last night. Knee pain and swelling. Initial encounter. EXAM: RIGHT KNEE - COMPLETE 4+ VIEW COMPARISON:  None. FINDINGS: Moderate knee joint effusion is seen. Nondisplaced fracture is seen involving the patella. No other osseous abnormality identified. IMPRESSION: 1. Nondisplaced fracture of the patella. 2. Moderate knee joint effusion. Electronically Signed   By: 07/30/2019 M.D.   On: 07/28/2019 18:58    Procedures Procedures (including critical care time)  Medications Ordered in ED Medications  oxyCODONE (Oxy IR/ROXICODONE) immediate release tablet 5 mg (5 mg Oral Given 07/28/19 2152)    ED Course  I have reviewed the triage vital signs and the nursing notes.  Pertinent labs & imaging results that were available during my care of the patient were reviewed by me and considered in my medical decision making (see chart for details).    MDM Rules/Calculators/A&P                     Terri Herrera is a otherwise healthy 29 year old woman who presents today for evaluation of pain in her right knee after a mechanical, nonsyncopal fall that occurred yesterday.  On exam she has a moderate to large right knee effusion.  Unable to test range of motion secondary to pain.  X-rays were obtained showing a nondisplaced fracture of the right patella.  Orthopedics is consulted, I spoke with Dr. [id number] who will see the patient in follow-up.  Knee immobilizer, crutches given.  [facility] PMP is consulted and prescription for short course of oxycodone is given.  Return precautions were discussed with  patient who states their understanding.  At the time of discharge patient denied any unaddressed complaints or concerns.  Patient is agreeable for discharge home.  Note: Portions of this report may have been transcribed using voice recognition software. Every effort was made to ensure accuracy; however, inadvertent computerized transcription errors may be present  Final Clinical  Impression(s) / ED Diagnoses Final diagnoses:  Closed nondisplaced longitudinal fracture of right patella, initial encounter    Rx / DC Orders ED Discharge Orders         Ordered    oxyCODONE (ROXICODONE) 5 MG immediate release tablet  Every 6 hours PRN     07/28/19 2209           Norman Clay 07/28/19 2227    Mancel Bale, MD 07/28/19 2326

## 2019-09-28 ENCOUNTER — Other Ambulatory Visit: Payer: Self-pay

## 2019-09-28 ENCOUNTER — Encounter (HOSPITAL_COMMUNITY): Payer: Self-pay | Admitting: Obstetrics and Gynecology

## 2019-09-28 ENCOUNTER — Inpatient Hospital Stay (HOSPITAL_COMMUNITY): Payer: Medicaid Other

## 2019-09-28 ENCOUNTER — Inpatient Hospital Stay (HOSPITAL_COMMUNITY)
Admission: AD | Admit: 2019-09-28 | Discharge: 2019-09-28 | Disposition: A | Discharge status: Home / Self Care | Payer: Medicaid Other | Attending: Obstetrics and Gynecology | Admitting: Obstetrics and Gynecology

## 2019-09-28 DIAGNOSIS — N898 Other specified noninflammatory disorders of vagina: Secondary | ICD-10-CM | POA: Insufficient documentation

## 2019-09-28 DIAGNOSIS — O99332 Smoking (tobacco) complicating pregnancy, second trimester: Secondary | ICD-10-CM | POA: Diagnosis not present

## 2019-09-28 DIAGNOSIS — R102 Pelvic and perineal pain: Secondary | ICD-10-CM | POA: Diagnosis present

## 2019-09-28 DIAGNOSIS — N9489 Other specified conditions associated with female genital organs and menstrual cycle: Secondary | ICD-10-CM | POA: Diagnosis not present

## 2019-09-28 DIAGNOSIS — N949 Unspecified condition associated with female genital organs and menstrual cycle: Secondary | ICD-10-CM | POA: Insufficient documentation

## 2019-09-28 DIAGNOSIS — F17210 Nicotine dependence, cigarettes, uncomplicated: Secondary | ICD-10-CM | POA: Insufficient documentation

## 2019-09-28 DIAGNOSIS — O0932 Supervision of pregnancy with insufficient antenatal care, second trimester: Secondary | ICD-10-CM | POA: Diagnosis not present

## 2019-09-28 DIAGNOSIS — Z634 Disappearance and death of family member: Secondary | ICD-10-CM | POA: Diagnosis not present

## 2019-09-28 DIAGNOSIS — O99312 Alcohol use complicating pregnancy, second trimester: Secondary | ICD-10-CM | POA: Diagnosis not present

## 2019-09-28 DIAGNOSIS — Z3492 Encounter for supervision of normal pregnancy, unspecified, second trimester: Secondary | ICD-10-CM

## 2019-09-28 DIAGNOSIS — O26892 Other specified pregnancy related conditions, second trimester: Secondary | ICD-10-CM | POA: Diagnosis not present

## 2019-09-28 DIAGNOSIS — Z3A17 17 weeks gestation of pregnancy: Secondary | ICD-10-CM | POA: Diagnosis not present

## 2019-09-28 DIAGNOSIS — Z7289 Other problems related to lifestyle: Secondary | ICD-10-CM | POA: Diagnosis not present

## 2019-09-28 LAB — HEPATITIS B SURFACE ANTIGEN: Hepatitis B Surface Ag: NONREACTIVE

## 2019-09-28 LAB — CBC
HCT: 31.1 % — ABNORMAL LOW (ref 36.0–46.0)
Hemoglobin: 10.7 g/dL — ABNORMAL LOW (ref 12.0–15.0)
MCH: 33.3 pg (ref 26.0–34.0)
MCHC: 34.4 g/dL (ref 30.0–36.0)
MCV: 96.9 fL (ref 80.0–100.0)
Platelets: 268 10*3/uL (ref 150–400)
RBC: 3.21 MIL/uL — ABNORMAL LOW (ref 3.87–5.11)
RDW: 13.8 % (ref 11.5–15.5)
WBC: 7 10*3/uL (ref 4.0–10.5)
nRBC: 0 % (ref 0.0–0.2)

## 2019-09-28 LAB — URINALYSIS, ROUTINE W REFLEX MICROSCOPIC
Bilirubin Urine: NEGATIVE
Glucose, UA: NEGATIVE mg/dL
Hgb urine dipstick: NEGATIVE
Ketones, ur: NEGATIVE mg/dL
Leukocytes,Ua: NEGATIVE
Nitrite: NEGATIVE
Protein, ur: NEGATIVE mg/dL
Specific Gravity, Urine: 1.006 (ref 1.005–1.030)
pH: 7 (ref 5.0–8.0)

## 2019-09-28 LAB — WET PREP, GENITAL
Clue Cells Wet Prep HPF POC: NONE SEEN
Sperm: NONE SEEN
Trich, Wet Prep: NONE SEEN
Yeast Wet Prep HPF POC: NONE SEEN

## 2019-09-28 LAB — DIFFERENTIAL
Abs Immature Granulocytes: 0.01 10*3/uL (ref 0.00–0.07)
Basophils Absolute: 0 10*3/uL (ref 0.0–0.1)
Basophils Relative: 0 %
Eosinophils Absolute: 0.1 10*3/uL (ref 0.0–0.5)
Eosinophils Relative: 1 %
Immature Granulocytes: 0 %
Lymphocytes Relative: 23 %
Lymphs Abs: 1.6 10*3/uL (ref 0.7–4.0)
Monocytes Absolute: 0.3 10*3/uL (ref 0.1–1.0)
Monocytes Relative: 4 %
Neutro Abs: 5 10*3/uL (ref 1.7–7.7)
Neutrophils Relative %: 72 %

## 2019-09-28 LAB — HIV ANTIBODY (ROUTINE TESTING W REFLEX): HIV Screen 4th Generation wRfx: NONREACTIVE

## 2019-09-28 MED ORDER — COMFORT FIT MATERNITY SUPP MED MISC
0 refills | Status: DC
Start: 2019-09-28 — End: 2020-03-02

## 2019-09-28 NOTE — MAU Provider Note (Signed)
History     CSN: [id number]  Arrival date and time: 09/28/19 1359   First Provider Initiated Contact with Patient 09/28/19 1455      Chief Complaint  Patient presents with  . Pelvic Pain   Terri Herrera is a 29 y.o. [id number] at [redacted]w[redacted]d by unsure LMP who has not established prenatal care due to social issues.   She presents today for Pelvic Pain.  She states she experiencing sharp shooting pain with opening her legs.  Patient states the pain lasts about 3 minutes when it occurs and resolves without intervention. Patient states sitting down helps.  Patient states she has not tried any OTC medications for her pain.  Patient denies perception of fetal movement currently.  Patient reports that she has been consuming alcoholic beverages during the pregnancy-wine last night and beer a couple of weeks ago while attempting to cope with the loss of a family member.    OB History    Gravida  6   Para  2   Term  2   Preterm      AB  3   Living  2     SAB  3   TAB      Ectopic      Multiple  0   Live Births  2        Obstetric Comments  Admitted ETOH daily and tobacco use during pregnancy        Past Medical History:  Diagnosis Date  . Alcohol use affecting pregnancy   . Bronchitis   . Gonorrhea   . Vaginal Pap smear, abnormal     Past Surgical History:  Procedure Laterality Date  . broken knee May 2021 right    . WISDOM TOOTH EXTRACTION      Family History  Problem Relation Age of Onset  . Cancer Paternal Grandmother   . Asthma Mother   . Diabetes Mother   . Alcohol abuse Mother   . Cancer Father   . Cancer Paternal Uncle     Social History   Tobacco Use  . Smoking status: Light Tobacco Smoker    Packs/day: 0.25    Types: Cigarettes  . Smokeless tobacco: Never Used  Vaping Use  . Vaping Use: Never used  Substance Use Topics  . Alcohol use: Yes    Alcohol/week: 72.0 standard drinks    Types: 72 Cans of beer per week    Comment: 6 beers a day; last  in June 2021  . Drug use: Not Currently    Allergies:  Allergies  Allergen Reactions  . Acetaminophen     No reaction; pt prefers not to take    Medications Prior to Admission  Medication Sig Dispense Refill Last Dose  . ibuprofen (ADVIL,MOTRIN) 800 MG tablet Take 1 tablet (800 mg total) by mouth every 8 (eight) hours as needed. (Patient not taking: Reported on 07/28/2019) 30 tablet 0   . oxyCODONE (ROXICODONE) 5 MG immediate release tablet Take 1 tablet (5 mg total) by mouth every 6 (six) hours as needed for severe pain. 10 tablet 0   . Prenatal Vit-Fe Fumarate-FA (PRENATAL VITAMIN) 27-0.8 MG TABS Take 1 tablet by mouth at bedtime. (Patient not taking: Reported on 07/28/2019) 30 tablet 4     Review of Systems  Constitutional: Negative for chills and fever.  Respiratory: Negative for cough and shortness of breath.   Gastrointestinal: Negative for abdominal pain, constipation, diarrhea, nausea and vomiting.  Genitourinary: Positive for pelvic pain (  When attempting to "spread legs like going up the stairs." ) and vaginal discharge. Negative for difficulty urinating, dysuria and vaginal bleeding.  Musculoskeletal: Positive for back pain (Yesterday, but none currently).  Neurological: Negative for dizziness, light-headedness and headaches.   Physical Exam   Blood pressure 110/66, pulse (!) 118, temperature 98.7 F (37.1 C), temperature source Oral, resp. rate 18, height 5\' 3"  (1.6 m), weight 56.9 kg, last menstrual period 05/27/2019, SpO2 100 %, unknown if currently breastfeeding.  Physical Exam Constitutional:      Appearance: Normal appearance.  HENT:     Head: Normocephalic and atraumatic.  Eyes:     Conjunctiva/sclera: Conjunctivae normal.  Cardiovascular:     Rate and Rhythm: Regular rhythm. Tachycardia present.     Heart sounds: Normal heart sounds.  Pulmonary:     Effort: Pulmonary effort is normal. No respiratory distress.     Breath sounds: Normal breath sounds.    Abdominal:     General: Abdomen is flat. Bowel sounds are normal.     Palpations: Abdomen is soft.     Comments: FH at 15cm  Genitourinary:    Comments: Speculum Exam: -Normal External Genitalia: Non tender, no apparent discharge at introitus.  -Vaginal Vault: Pink mucosa with good rugae. Small amt thin white discharge -wet prep collected -Cervix:Pink, no lesions, cysts, or polyps.  Appears closed. No active bleeding from os-GC/CT collected -Bimanual Exam:  Uterine size c/w 15-[redacted] wk GA.  Musculoskeletal:        General: Normal range of motion.     Cervical back: Normal range of motion.  Skin:    General: Skin is warm and dry.  Neurological:     Mental Status: She is alert and oriented to person, place, and time.  Psychiatric:        Mood and Affect: Mood normal.        Thought Content: Thought content normal.        Judgment: Judgment normal.     MAU Course  Procedures Results for orders placed or performed during the hospital encounter of 09/28/19 (from the past 24 hour(s))  Urinalysis, Routine w reflex microscopic     Status: None   Collection Time: 09/28/19  2:31 PM  Result Value Ref Range   Color, Urine YELLOW YELLOW   APPearance CLEAR CLEAR   Specific Gravity, Urine 1.006 1.005 - 1.030   pH 7.0 5.0 - 8.0   Glucose, UA NEGATIVE NEGATIVE mg/dL   Hgb urine dipstick NEGATIVE NEGATIVE   Bilirubin Urine NEGATIVE NEGATIVE   Ketones, ur NEGATIVE NEGATIVE mg/dL   Protein, ur NEGATIVE NEGATIVE mg/dL   Nitrite NEGATIVE NEGATIVE   Leukocytes,Ua NEGATIVE NEGATIVE  Wet prep, genital     Status: Abnormal   Collection Time: 09/28/19  3:03 PM  Result Value Ref Range   Yeast Wet Prep HPF POC NONE SEEN NONE SEEN   Trich, Wet Prep NONE SEEN NONE SEEN   Clue Cells Wet Prep HPF POC NONE SEEN NONE SEEN   WBC, Wet Prep HPF POC MODERATE (A) NONE SEEN   Sperm NONE SEEN   Hepatitis B surface antigen     Status: None   Collection Time: 09/28/19  4:00 PM  Result Value Ref Range    Hepatitis B Surface Ag NON REACTIVE NON REACTIVE  CBC     Status: Abnormal   Collection Time: 09/28/19  4:00 PM  Result Value Ref Range   WBC 7.0 4.0 - 10.5 K/uL   RBC 3.21 (L) 3.87 -  5.11 MIL/uL   Hemoglobin 10.7 (L) 12.0 - 15.0 g/dL   HCT 93.7 (L) 36 - 46 %   MCV 96.9 80.0 - 100.0 fL   MCH 33.3 26.0 - 34.0 pg   MCHC 34.4 30.0 - 36.0 g/dL   RDW 34.2 87.6 - 81.1 %   Platelets 268 150 - 400 K/uL   nRBC 0.0 0.0 - 0.2 %  Differential     Status: None   Collection Time: 09/28/19  4:00 PM  Result Value Ref Range   Neutrophils Relative % 72 %   Neutro Abs 5.0 1.7 - 7.7 K/uL   Lymphocytes Relative 23 %   Lymphs Abs 1.6 0.7 - 4.0 K/uL   Monocytes Relative 4 %   Monocytes Absolute 0.3 0 - 1 K/uL   Eosinophils Relative 1 %   Eosinophils Absolute 0.1 0 - 0 K/uL   Basophils Relative 0 %   Basophils Absolute 0.0 0 - 0 K/uL   Immature Granulocytes 0 %   Abs Immature Granulocytes 0.01 0.00 - 0.07 K/uL  HIV Antibody (routine testing w rflx)     Status: None   Collection Time: 09/28/19  4:00 PM  Result Value Ref Range   HIV Screen 4th Generation wRfx Non Reactive Non Reactive    MDM Education Pelvic Exam Labs: Wet Prep, GC/CT, OB Panel BSUS   Patient informed that the ultrasound is considered a limited OB ultrasound and is not intended to be a complete ultrasound exam.  Patient also informed that the ultrasound is not being completed with the intent of assessing for fetal or placental anomalies or any pelvic abnormalities.  Explained that the purpose of today's ultrasound is to assess for  viability.  Patient acknowledges the purpose of the exam and the limitations of the study.       Assessment and Plan  29 year old  [id number] at 17.5 weeks No PNC Round Ligament Pain Vaginal Discharge Alcohol Usage   -POC Reviewed. -Educated on round ligament pain and how it presents in pregnancy. -Reviewed usage of maternity belt.  Will give script and information.  -Exam performed and  findings discussed. -Cultures collected and pending.  -Encouraged discontinuation of ETOH in pregnancy regardless of limited use.  -BSUS completed and GA ~14 weeks by provider exam.  Patient informed and discussed need for follow up dating [place]. -Will send message to [facility] for scheduling of NOB visit and dating [place].  -Will await results.   Cherre Robins 09/28/2019, 2:55 PM   Reassessment (4:00 PM)  -Wet prep returns negative. -Provider to bedside to discuss results. -Informed that appt and [place] requested via message.  Patient confirms contact information on file. -Patient questions why she has a decreased appetite and if we can prescribe any medications for her.  Informed that no medication for appetite stimulation. Discussed small frequent meals throughout the day. -Patient without questions or concerns. -Encouraged to call or return to MAU if symptoms worsen or with the onset of new symptoms. -Discharged to home in stable condition.  Cherre Robins MSN, CNM Advanced Practice Provider, [facility]

## 2019-09-28 NOTE — MAU Note (Signed)
Pelvic pain/pain between the legs started a few days ago.  It hurts when she walks.  Denies abdominal pain.  LMP May 27, 2019.  Denies vaginal bleeding or LOF.

## 2019-09-28 NOTE — Discharge Instructions (Signed)
Alcohol Use During Pregnancy  Drinking alcohol while you are pregnant can harm your unborn baby and cause long-term problems for your baby after birth. When you drink alcohol, it passes through your placenta to your baby. A baby's liver cannot process alcohol like an adult's liver can, because it is not mature enough. How does this affect me? Drinking alcohol during pregnancy can increase your risk of:  Miscarriage.  Stillbirth.  Premature delivery.  Injury from falls.  Domestic violence.  Mood disorders, such as depression and postpartum depression. How does this affect my baby? Drinking alcohol during pregnancy can cause fetal alcohol spectrum disorder in your unborn baby (fetus). You are at the highest risk of having a baby affected by alcohol if you have 7 or more drinks a week through most of your pregnancy. Fetal alcohol spectrum disorder (FASD) is a group of birth defects that can occur in a child whose mother drank excessive amounts of alcohol during pregnancy. Defects can include:  Behavior and attention problems.  Disorders of the brain and spinal cord (central nervous system disorders).  Problems with the heart, kidneys, and bones.  Decreased growth before and after birth.  Learning problems and intellectual disabilities.  Speech and language problems.  Vision and hearing problems.  Smaller head size than normal.  Sleep and sucking disorders.  Changes in the shape of the face. The only way to prevent the condition is to stop drinking alcohol the moment you realize that you are pregnant. Can I have any alcohol during my pregnancy? No amount of alcohol is safe during pregnancy. Because of this, it is best not to consume any alcohol at all. Do not drink alcohol if:  You plan to become pregnant.  You think you may be pregnant. If you are struggling not to drink, ask your health care provider for help. If you have a drinking problem, the sooner you get help to  stop, the better your chance of having a healthy baby. Where to find more information  [employer] on Alcohol Abuse and Alcoholism: BasicStudents.dk  Substance Abuse and Mental Health Services Administration: www.fascenter.RockToxic.pl Summary  Drinking alcohol during your pregnancy can cause life-long harm to your developing baby.  No amount of alcohol is safe during pregnancy.  Stop drinking alcohol if you think you are pregnant or could become pregnant.  If you are pregnant and you are not able to stop drinking alcohol, talk with your health care provider right away. This information is not intended to replace advice given to you by your health care provider. Make sure you discuss any questions you have with your health care provider. Document Revised: 06/17/2018 Document Reviewed: 11/01/2016 Elsevier Patient Education  2020 [employer].

## 2019-09-29 LAB — GC/CHLAMYDIA PROBE AMP (~~LOC~~) NOT AT ARMC
Chlamydia: NEGATIVE
Comment: NEGATIVE
Comment: NORMAL
Neisseria Gonorrhea: NEGATIVE

## 2019-09-29 LAB — RPR: RPR Ser Ql: NONREACTIVE

## 2019-09-29 LAB — RUBELLA SCREEN: Rubella: 3.96 index (ref >0.99)

## 2019-10-02 ENCOUNTER — Encounter: Payer: Medicaid Other | Admitting: Obstetrics

## 2019-10-02 DIAGNOSIS — Z3480 Encounter for supervision of other normal pregnancy, unspecified trimester: Secondary | ICD-10-CM | POA: Insufficient documentation

## 2019-10-06 ENCOUNTER — Ambulatory Visit: Payer: Medicaid Other

## 2019-10-12 ENCOUNTER — Ambulatory Visit: Payer: Medicaid Other

## 2020-01-06 ENCOUNTER — Inpatient Hospital Stay (HOSPITAL_COMMUNITY)
Admission: AD | Admit: 2020-01-06 | Discharge: 2020-01-06 | Disposition: A | Discharge status: Home / Self Care | Payer: Medicaid Other | Attending: Obstetrics & Gynecology | Admitting: Obstetrics & Gynecology

## 2020-01-06 ENCOUNTER — Inpatient Hospital Stay (HOSPITAL_COMMUNITY): Payer: Medicaid Other

## 2020-01-06 ENCOUNTER — Other Ambulatory Visit: Payer: Self-pay

## 2020-01-06 ENCOUNTER — Encounter (HOSPITAL_COMMUNITY): Payer: Self-pay | Admitting: Obstetrics & Gynecology

## 2020-01-06 DIAGNOSIS — F17210 Nicotine dependence, cigarettes, uncomplicated: Secondary | ICD-10-CM | POA: Insufficient documentation

## 2020-01-06 DIAGNOSIS — R1032 Left lower quadrant pain: Secondary | ICD-10-CM | POA: Diagnosis not present

## 2020-01-06 DIAGNOSIS — B9689 Other specified bacterial agents as the cause of diseases classified elsewhere: Secondary | ICD-10-CM

## 2020-01-06 DIAGNOSIS — D529 Folate deficiency anemia, unspecified: Secondary | ICD-10-CM | POA: Diagnosis not present

## 2020-01-06 DIAGNOSIS — F17200 Nicotine dependence, unspecified, uncomplicated: Secondary | ICD-10-CM

## 2020-01-06 DIAGNOSIS — O26893 Other specified pregnancy related conditions, third trimester: Secondary | ICD-10-CM | POA: Diagnosis not present

## 2020-01-06 DIAGNOSIS — F1010 Alcohol abuse, uncomplicated: Secondary | ICD-10-CM

## 2020-01-06 DIAGNOSIS — O99013 Anemia complicating pregnancy, third trimester: Secondary | ICD-10-CM | POA: Diagnosis not present

## 2020-01-06 DIAGNOSIS — R1012 Left upper quadrant pain: Secondary | ICD-10-CM | POA: Diagnosis not present

## 2020-01-06 DIAGNOSIS — D539 Nutritional anemia, unspecified: Secondary | ICD-10-CM | POA: Diagnosis not present

## 2020-01-06 DIAGNOSIS — Z3A32 32 weeks gestation of pregnancy: Secondary | ICD-10-CM | POA: Insufficient documentation

## 2020-01-06 DIAGNOSIS — N760 Acute vaginitis: Secondary | ICD-10-CM

## 2020-01-06 DIAGNOSIS — O99313 Alcohol use complicating pregnancy, third trimester: Secondary | ICD-10-CM

## 2020-01-06 DIAGNOSIS — O0933 Supervision of pregnancy with insufficient antenatal care, third trimester: Secondary | ICD-10-CM

## 2020-01-06 DIAGNOSIS — O99333 Smoking (tobacco) complicating pregnancy, third trimester: Secondary | ICD-10-CM | POA: Diagnosis not present

## 2020-01-06 DIAGNOSIS — Z3689 Encounter for other specified antenatal screening: Secondary | ICD-10-CM

## 2020-01-06 DIAGNOSIS — O99891 Other specified diseases and conditions complicating pregnancy: Secondary | ICD-10-CM

## 2020-01-06 LAB — CBC WITH DIFFERENTIAL/PLATELET
Abs Immature Granulocytes: 0.02 10*3/uL (ref 0.00–0.07)
Basophils Absolute: 0 10*3/uL (ref 0.0–0.1)
Basophils Relative: 0 %
Eosinophils Absolute: 0.1 10*3/uL (ref 0.0–0.5)
Eosinophils Relative: 1 %
HCT: 27.9 % — ABNORMAL LOW (ref 36.0–46.0)
Hemoglobin: 9.4 g/dL — ABNORMAL LOW (ref 12.0–15.0)
Immature Granulocytes: 0 %
Lymphocytes Relative: 18 %
Lymphs Abs: 1.3 10*3/uL (ref 0.7–4.0)
MCH: 34.3 pg — ABNORMAL HIGH (ref 26.0–34.0)
MCHC: 33.7 g/dL (ref 30.0–36.0)
MCV: 101.8 fL — ABNORMAL HIGH (ref 80.0–100.0)
Monocytes Absolute: 0.5 10*3/uL (ref 0.1–1.0)
Monocytes Relative: 7 %
Neutro Abs: 5.3 10*3/uL (ref 1.7–7.7)
Neutrophils Relative %: 74 %
Platelets: 249 10*3/uL (ref 150–400)
RBC: 2.74 MIL/uL — ABNORMAL LOW (ref 3.87–5.11)
RDW: 12.4 % (ref 11.5–15.5)
WBC: 7.2 10*3/uL (ref 4.0–10.5)
nRBC: 0 % (ref 0.0–0.2)

## 2020-01-06 LAB — URINALYSIS, ROUTINE W REFLEX MICROSCOPIC
Bilirubin Urine: NEGATIVE
Glucose, UA: NEGATIVE mg/dL
Hgb urine dipstick: NEGATIVE
Ketones, ur: NEGATIVE mg/dL
Leukocytes,Ua: NEGATIVE
Nitrite: NEGATIVE
Protein, ur: NEGATIVE mg/dL
Specific Gravity, Urine: 1.02 (ref 1.005–1.030)
pH: 6.5 (ref 5.0–8.0)

## 2020-01-06 LAB — RAPID URINE DRUG SCREEN, HOSP PERFORMED
Amphetamines: NOT DETECTED
Barbiturates: NOT DETECTED
Benzodiazepines: NOT DETECTED
Cocaine: NOT DETECTED
Opiates: NOT DETECTED
Tetrahydrocannabinol: NOT DETECTED

## 2020-01-06 LAB — COMPREHENSIVE METABOLIC PANEL
ALT: 32 U/L (ref 0–44)
AST: 27 U/L (ref 15–41)
Albumin: 3.2 g/dL — ABNORMAL LOW (ref 3.5–5.0)
Alkaline Phosphatase: 26 U/L — ABNORMAL LOW (ref 38–126)
Anion gap: 10 (ref 5–15)
BUN: <5 mg/dL — ABNORMAL LOW (ref 6–20)
CO2: 20 mmol/L — ABNORMAL LOW (ref 22–32)
Calcium: 9 mg/dL (ref 8.9–10.3)
Chloride: 106 mmol/L (ref 98–111)
Creatinine, Ser: 0.49 mg/dL (ref 0.44–1.00)
GFR, Estimated: >60 mL/min (ref >60)
Glucose, Bld: 92 mg/dL (ref 70–99)
Potassium: 3.7 mmol/L (ref 3.5–5.1)
Sodium: 136 mmol/L (ref 135–145)
Total Bilirubin: 0.8 mg/dL (ref 0.3–1.2)
Total Protein: 6.3 g/dL — ABNORMAL LOW (ref 6.5–8.1)

## 2020-01-06 LAB — WET PREP, GENITAL
Sperm: NONE SEEN
Trich, Wet Prep: NONE SEEN
Yeast Wet Prep HPF POC: NONE SEEN

## 2020-01-06 LAB — AMYLASE: Amylase: 45 U/L (ref 28–100)

## 2020-01-06 LAB — LIPASE, BLOOD: Lipase: 21 U/L (ref 11–51)

## 2020-01-06 MED ORDER — METRONIDAZOLE 0.75 % VA GEL: 1.0000 | Freq: Every day | VAGINAL | 0 refills | Status: DC

## 2020-01-06 MED ORDER — TERCONAZOLE 0.8 % VA CREA: 1.0000 | TOPICAL_CREAM | Freq: Every day | VAGINAL | 0 refills | Status: DC

## 2020-01-06 MED ORDER — SENNA-DOCUSATE SODIUM 8.6-50 MG PO TABS: 1.0000 | ORAL_TABLET | Freq: Every day | ORAL | 1 refills | Status: DC

## 2020-01-06 NOTE — Discharge Instructions (Signed)

## 2020-01-06 NOTE — MAU Provider Note (Addendum)
History     CSN: [id number]  Arrival date and time: 01/06/20 5784     Chief Complaint  Patient presents with  . Abdominal Pain   HPI Terri Herrera is a 29 yo [id number] female at [redacted]w[redacted]d who presents today for LLQ pain. She reports this started several weeks ago. With movements such as getting out of bed, climbing stairs, or lying flat she has a sharp LLQ pain that radiates into her pelvis. This is 10/10 pain at its worst and lasts ~1.5 hours before subsiding. She states that with rest and an appropriate position - sitting, flexed at the hip - she can get comfortable and sleep without discomfort. She has not tried any OTC meds for pain management as she is allergic to Tylenol.   She reports that her pain is somewhat similar to her symptoms of round ligament pain in July but that currently her pain is more severe and lasts longer when aggravated. She denies using the maternity belt prescribed at her last visit as she accidentally machine washed the prescription.  Reports thick white vaginal discharge x1 week without pain, burning, itching, or odor.  She has no established prenatal care provider as there are several social and family situations that have prevented her from making appointments. Currently smoking 5-6 cigarettes today and infrequent alcohol consumption, most recently 1 glass of wine several days ago.  She denies fever, body aches, n/v, diarrhea, urinary complaints, HA, changes in vision, and RUQ pain.   Past Medical History:  Diagnosis Date  . Alcohol use affecting pregnancy   . Bronchitis   . Gonorrhea   . Vaginal Pap smear, abnormal     Past Surgical History:  Procedure Laterality Date  . broken knee May 2021 right    . WISDOM TOOTH EXTRACTION      Family History  Problem Relation Age of Onset  . Cancer Paternal Grandmother   . Asthma Mother   . Diabetes Mother   . Alcohol abuse Mother   . Cancer Father   . Cancer Paternal Uncle     Social History   Tobacco  Use  . Smoking status: Light Tobacco Smoker    Packs/day: 0.25    Types: Cigarettes  . Smokeless tobacco: Never Used  Vaping Use  . Vaping Use: Never used  Substance Use Topics  . Alcohol use: Yes    Alcohol/week: 72.0 standard drinks    Types: 72 Cans of beer per week    Comment: occasional  . Drug use: Not Currently    Allergies:  Allergies  Allergen Reactions  . Acetaminophen     No reaction; pt prefers not to take    Medications Prior to Admission  Medication Sig Dispense Refill Last Dose  . Elastic Bandages & Supports (COMFORT FIT MATERNITY SUPP MED) MISC Wear belt during the day, as needed, and remove at night prior to bedtime. 1 each 0   . Prenatal Vit-Fe Fumarate-FA (PRENATAL VITAMIN) 27-0.8 MG TABS Take 1 tablet by mouth at bedtime. (Patient not taking: Reported on 07/28/2019) 30 tablet 4     Review of Systems  Constitutional: Negative for chills and fever.  HENT: Negative for congestion and sore throat.   Eyes: Negative for pain, redness and visual disturbance.  Respiratory: Negative for cough, chest tightness and shortness of breath.   Cardiovascular: Negative for chest pain and leg swelling.  Gastrointestinal: Positive for abdominal pain. Negative for diarrhea, nausea and vomiting.  Genitourinary: Positive for vaginal discharge (thick white discharge x1  week). Negative for dysuria, flank pain, frequency, vaginal bleeding and vaginal pain.  Musculoskeletal: Negative for myalgias.  Neurological: Negative for headaches.   Physical Exam   Blood pressure 114/67, pulse (!) 106, temperature 98.6 F (37 C), resp. rate 18, height 5\' 3"  (1.6 m), weight 60.8 kg, last menstrual period 05/27/2019, unknown if currently breastfeeding.  Physical Exam Vitals and nursing note reviewed.  Constitutional:      Appearance: She is well-developed.  HENT:     Head: Normocephalic.  Cardiovascular:     Rate and Rhythm: Normal rate and regular rhythm.     Heart sounds: No murmur  heard.   Pulmonary:     Effort: Pulmonary effort is normal.     Breath sounds: Normal breath sounds.  Abdominal:     General: Abdomen is protuberant. Bowel sounds are normal.     Palpations: Abdomen is soft.     Tenderness: There is abdominal tenderness in the left lower quadrant.  Genitourinary:    Comments: Speculum Exam: -Normal External Genitalia: Non tender, small amount of thin white discharge at introitus.  -Vaginal Vault: Pink mucosa with good rugae. Thin white discharge -wet prep collected -Cervix:Pink, no lesions, cysts, or polyps.  Appears closed. No active bleeding from os-GC/CT collected -Fundal Height: 28 cm. Skin:    General: Skin is warm and dry.  Neurological:     General: No focal deficit present.     Mental Status: She is alert and oriented to person, place, and time.  Psychiatric:        Mood and Affect: Mood normal.        Behavior: Behavior normal.     MAU Course  Procedures  Results for orders placed or performed during the hospital encounter of 01/06/20 (from the past 24 hour(s))  Urinalysis, Routine w reflex microscopic Urine, Clean Catch     Status: None   Collection Time: 01/06/20 11:15 AM  Result Value Ref Range   Color, Urine YELLOW YELLOW   APPearance CLEAR CLEAR   Specific Gravity, Urine 1.020 1.005 - 1.030   pH 6.5 5.0 - 8.0   Glucose, UA NEGATIVE NEGATIVE mg/dL   Hgb urine dipstick NEGATIVE NEGATIVE   Bilirubin Urine NEGATIVE NEGATIVE   Ketones, ur NEGATIVE NEGATIVE mg/dL   Protein, ur NEGATIVE NEGATIVE mg/dL   Nitrite NEGATIVE NEGATIVE   Leukocytes,Ua NEGATIVE NEGATIVE  Rapid urine drug screen (hospital performed)     Status: None   Collection Time: 01/06/20 11:27 AM  Result Value Ref Range   Opiates NONE DETECTED NONE DETECTED   Cocaine NONE DETECTED NONE DETECTED   Benzodiazepines NONE DETECTED NONE DETECTED   Amphetamines NONE DETECTED NONE DETECTED   Tetrahydrocannabinol NONE DETECTED NONE DETECTED   Barbiturates NONE DETECTED  NONE DETECTED  Wet prep, genital     Status: Abnormal   Collection Time: 01/06/20 11:36 AM   Specimen: PATH Cytology Cervicovaginal Ancillary Only  Result Value Ref Range   Yeast Wet Prep HPF POC NONE SEEN NONE SEEN   Trich, Wet Prep NONE SEEN NONE SEEN   Clue Cells Wet Prep HPF POC PRESENT (A) NONE SEEN   WBC, Wet Prep HPF POC MANY (A) NONE SEEN   Sperm NONE SEEN   CBC with Differential/Platelet     Status: Abnormal   Collection Time: 01/06/20 11:55 AM  Result Value Ref Range   WBC 7.2 4.0 - 10.5 K/uL   RBC 2.74 (L) 3.87 - 5.11 MIL/uL   Hemoglobin 9.4 (L) 12.0 - 15.0 g/dL  HCT 27.9 (L) 36 - 46 %   MCV 101.8 (H) 80.0 - 100.0 fL   MCH 34.3 (H) 26.0 - 34.0 pg   MCHC 33.7 30.0 - 36.0 g/dL   RDW 63.8 75.6 - 43.3 %   Platelets 249 150 - 400 K/uL   nRBC 0.0 0.0 - 0.2 %   Neutrophils Relative % 74 %   Neutro Abs 5.3 1.7 - 7.7 K/uL   Lymphocytes Relative 18 %   Lymphs Abs 1.3 0.7 - 4.0 K/uL   Monocytes Relative 7 %   Monocytes Absolute 0.5 0.1 - 1.0 K/uL   Eosinophils Relative 1 %   Eosinophils Absolute 0.1 0.0 - 0.5 K/uL   Basophils Relative 0 %   Basophils Absolute 0.0 0.0 - 0.1 K/uL   Immature Granulocytes 0 %   Abs Immature Granulocytes 0.02 0.00 - 0.07 K/uL   US Abdomen Limited  Result Date: 01/06/2020 CLINICAL DATA:  Right upper quadrant pain EXAM: ULTRASOUND ABDOMEN LIMITED RIGHT UPPER QUADRANT COMPARISON:  03/07/2010 FINDINGS: Gallbladder: Collapsed gallbladder with multiple folds, similar to the CT of 03/07/2010. No definite gallstones. No Murphy's sign or pericholecystic fluid. Small amount of sludge suspected. Normal wall thickness measuring 1.2 mm. Common bile duct: Diameter: 2.6 mm Liver: No focal lesion identified. Within normal limits in parenchymal echogenicity. Portal vein is patent on color Doppler imaging with normal direction of blood flow towards the liver. Other: None. IMPRESSION: Collapsed gallbladder with multiple folds and trace sludge. Otherwise negative for  gallstones or cholecystitis. No biliary dilatation No hepatic abnormality Electronically Signed   By: Judie Petit.  Shick M.D.   On: 01/06/2020 12:54     MDM -Pelvic Exam -CBC, CMP, Amylase, Lipase -Wet prep, GC/CT -UDS -Abdominal US   Assessment and Plan  29 year old [id number] @[redacted]w[redacted]d  No PNC on prenatal vitamin Round Ligament Pain Macrocytic anemia Bacterial Vaginosis Tobacco use during pregnancy Alcohol use during pregnancy  -Macrocytic anemia likely do to folate insufficiency from increased pregnancy demand. Counseled on taking prenatal vitamin consistently with increased folate demand during pregnancy. -Wet prep positive for BV. -GC/Chlamydia pending. - without clear indication for abdominal pain. -Counseled on importance of prenatal care. [facility]. -Education on round ligament pain - stretches, rest, maternity belt, and pain relievers. -Pt to be d/c home with return precautions. -F/u CMP, Amylase, Lipase   [place] 01/06/2020, 10:34 AM    Attestation of Supervision of Student:  I confirm that I have verified the information documented in the medical student's note and that I have also personally reperformed the history, physical exam and all medical decision making activities.  I have verified that all services and findings are accurately documented in this student's note; and I agree with management and plan as outlined in the documentation. I have also made any necessary editorial changes.  -Provider to bedside. Reviewed findings and reiterated discussion regarding round ligament pain vs sciatica pain.   Exam completed: -FH ~28cm -Pain elicited with fundal height pressure. -Mild distention and tympany noted. -Will send for abdominal 01/08/2020 and labs.  Reassessment (1:34 PM) -[place] and Labs Return as above. -Results reviewed with Dr. [place] who was on unit. Advised: *No formal follow up needed as sounds as chronic  state. *Encouraged prenatal start.  -[facility] sent to [facility] for initiation of care  Reassessment (2:13 PM) -Provider to bedside and results discussed. -Informed that Rx for metrogel sent to pharmacy on file. -Patient informed that message sent to  [facility] for appt. -Encouraged to call or return to MAU if symptoms worsen or with the onset of new symptoms. -Discharged to home in stable condition.  Cherre Robins MSN, CNM Advanced Practice Provider, [facility]

## 2020-01-06 NOTE — MAU Note (Signed)
Pt reports having a sharp pain in the bottom of her stomach that gets worse when she walks or when the baby moves. Had this pain for several weeks. Good fetal movement felt. Pt also reports she fell on her side(when she fainted) on Monday.

## 2020-01-07 LAB — GC/CHLAMYDIA PROBE AMP (~~LOC~~) NOT AT ARMC
Chlamydia: NEGATIVE
Comment: NEGATIVE
Comment: NORMAL
Neisseria Gonorrhea: NEGATIVE

## 2020-01-19 ENCOUNTER — Encounter: Payer: Medicaid Other | Admitting: Advanced Practice Midwife

## 2020-01-19 ENCOUNTER — Telehealth: Payer: Self-pay | Admitting: Family Medicine

## 2020-01-19 NOTE — Telephone Encounter (Signed)
Attempted to reach patient about her New OB appointment today. She has [employer] Medicaid, and will need to get that switched to either [employer], or [employer], or [employer]. Was not able to leave a message on her phone.

## 2020-03-02 ENCOUNTER — Other Ambulatory Visit: Payer: Self-pay

## 2020-03-02 ENCOUNTER — Inpatient Hospital Stay (HOSPITAL_BASED_OUTPATIENT_CLINIC_OR_DEPARTMENT_OTHER): Payer: Medicaid Other

## 2020-03-02 ENCOUNTER — Observation Stay (HOSPITAL_COMMUNITY)
Admission: AD | Admit: 2020-03-02 | Discharge: 2020-03-02 | Disposition: A | Discharge status: Home / Self Care | Payer: Medicaid Other | Attending: Family Medicine | Admitting: Family Medicine

## 2020-03-02 ENCOUNTER — Encounter (HOSPITAL_COMMUNITY): Payer: Self-pay | Admitting: Obstetrics and Gynecology

## 2020-03-02 DIAGNOSIS — O99013 Anemia complicating pregnancy, third trimester: Secondary | ICD-10-CM

## 2020-03-02 DIAGNOSIS — Z3A34 34 weeks gestation of pregnancy: Secondary | ICD-10-CM

## 2020-03-02 DIAGNOSIS — Z363 Encounter for antenatal screening for malformations: Secondary | ICD-10-CM | POA: Diagnosis not present

## 2020-03-02 DIAGNOSIS — O99333 Smoking (tobacco) complicating pregnancy, third trimester: Secondary | ICD-10-CM | POA: Diagnosis not present

## 2020-03-02 DIAGNOSIS — F17210 Nicotine dependence, cigarettes, uncomplicated: Secondary | ICD-10-CM | POA: Diagnosis not present

## 2020-03-02 DIAGNOSIS — D649 Anemia, unspecified: Secondary | ICD-10-CM | POA: Insufficient documentation

## 2020-03-02 DIAGNOSIS — O0933 Supervision of pregnancy with insufficient antenatal care, third trimester: Secondary | ICD-10-CM

## 2020-03-02 DIAGNOSIS — Z3687 Encounter for antenatal screening for uncertain dates: Secondary | ICD-10-CM | POA: Diagnosis not present

## 2020-03-02 DIAGNOSIS — Z3480 Encounter for supervision of other normal pregnancy, unspecified trimester: Secondary | ICD-10-CM

## 2020-03-02 DIAGNOSIS — Z20822 Contact with and (suspected) exposure to covid-19: Secondary | ICD-10-CM | POA: Diagnosis not present

## 2020-03-02 DIAGNOSIS — O368130 Decreased fetal movements, third trimester, not applicable or unspecified: Principal | ICD-10-CM | POA: Insufficient documentation

## 2020-03-02 DIAGNOSIS — O0990 Supervision of high risk pregnancy, unspecified, unspecified trimester: Secondary | ICD-10-CM

## 2020-03-02 DIAGNOSIS — O0930 Supervision of pregnancy with insufficient antenatal care, unspecified trimester: Secondary | ICD-10-CM

## 2020-03-02 DIAGNOSIS — O99330 Smoking (tobacco) complicating pregnancy, unspecified trimester: Secondary | ICD-10-CM

## 2020-03-02 LAB — URINALYSIS, ROUTINE W REFLEX MICROSCOPIC
Bilirubin Urine: NEGATIVE
Glucose, UA: NEGATIVE mg/dL
Hgb urine dipstick: NEGATIVE
Ketones, ur: NEGATIVE mg/dL
Leukocytes,Ua: NEGATIVE
Nitrite: NEGATIVE
Protein, ur: NEGATIVE mg/dL
Specific Gravity, Urine: 1.013 (ref 1.005–1.030)
pH: 7 (ref 5.0–8.0)

## 2020-03-02 LAB — CBC
HCT: 28.6 % — ABNORMAL LOW (ref 36.0–46.0)
Hemoglobin: 9.5 g/dL — ABNORMAL LOW (ref 12.0–15.0)
MCH: 33.6 pg (ref 26.0–34.0)
MCHC: 33.2 g/dL (ref 30.0–36.0)
MCV: 101.1 fL — ABNORMAL HIGH (ref 80.0–100.0)
Platelets: 254 10*3/uL (ref 150–400)
RBC: 2.83 MIL/uL — ABNORMAL LOW (ref 3.87–5.11)
RDW: 12.5 % (ref 11.5–15.5)
WBC: 7.2 10*3/uL (ref 4.0–10.5)
nRBC: 0 % (ref 0.0–0.2)

## 2020-03-02 LAB — HEMOGLOBIN A1C
Hgb A1c MFr Bld: 5.5 % (ref 4.8–5.6)
Mean Plasma Glucose: 111.15 mg/dL

## 2020-03-02 LAB — TYPE AND SCREEN
ABO/RH(D): O POS
Antibody Screen: NEGATIVE

## 2020-03-02 LAB — RESP PANEL BY RT-PCR (FLU A&B, COVID) ARPGX2
Influenza A by PCR: NEGATIVE
Influenza B by PCR: NEGATIVE
SARS Coronavirus 2 by RT PCR: NEGATIVE

## 2020-03-02 LAB — RPR: RPR Ser Ql: NONREACTIVE

## 2020-03-02 MED ORDER — SOD CITRATE-CITRIC ACID 500-334 MG/5ML PO SOLN: 30.0000 mL | ORAL | Status: DC | PRN

## 2020-03-02 MED ORDER — OXYTOCIN BOLUS FROM INFUSION: 333.0000 mL | Freq: Once | INTRAVENOUS | Status: DC

## 2020-03-02 MED ORDER — PRENATAL VITAMIN 27-0.8 MG PO TABS: 1.0000 | ORAL_TABLET | Freq: Every day | ORAL | 4 refills | Status: DC

## 2020-03-02 MED ORDER — ONDANSETRON HCL 4 MG/2ML IJ SOLN: 4.0000 mg | Freq: Four times a day (QID) | INTRAMUSCULAR | Status: DC | PRN

## 2020-03-02 MED ORDER — LACTATED RINGERS IV SOLN: INTRAVENOUS | Status: DC

## 2020-03-02 MED ORDER — LACTATED RINGERS IV SOLN: 500.0000 mL | INTRAVENOUS | Status: DC | PRN

## 2020-03-02 MED ORDER — LIDOCAINE HCL (PF) 1 % IJ SOLN: 30.0000 mL | INTRAMUSCULAR | Status: DC | PRN

## 2020-03-02 MED ORDER — FLEET ENEMA 7-19 GM/118ML RE ENEM: 1.0000 | ENEMA | RECTAL | Status: DC | PRN

## 2020-03-02 MED ORDER — FERROUS SULFATE 325 (65 FE) MG PO TABS: 325.0000 mg | ORAL_TABLET | ORAL | 3 refills | Status: DC

## 2020-03-02 MED ORDER — OXYTOCIN-SODIUM CHLORIDE 30-0.9 UT/500ML-% IV SOLN: 2.5000 [IU]/h | INTRAVENOUS | Status: DC

## 2020-03-02 NOTE — Discharge Summary (Signed)
Physician Discharge Summary  Patient ID: Terri Herrera MRN: [id number] DOB/AGE: 03-19-1990 29 y.o.  Admit date: 03/02/2020 Discharge date: 03/02/2020  Admission Diagnoses: Decreased Fetal Movement in Third Trimester  Discharge Diagnoses:  Active Problems:   Indication for care in labor or delivery   Decreased fetal movement affecting management of pregnancy in third trimester   [redacted] weeks gestation of pregnancy   Tobacco use in pregnancy, antepartum   Limited prenatal care   Anemia in pregnancy, third trimester  Discharged Condition: good  Hospital Course:  Terri Herrera is a 29 year old [id number] female at [redacted]w[redacted]d based on an ultrasound today s/p admission to L&D. Of note, pt was previously dated at [redacted]w[redacted]d by LMP with no prior ultrasounds. She presented to the MAU this morning with report of decreased fetal movement, and given reported GA of [redacted]w[redacted]d was admitted for IOL. Reassuringly, pt with reactive FHT and improved fetal movement s/p arrival to L&D. Prenatal labs were obtained; pt was started on ferrous sulfate given of Hgb 9.5. No reported complications in current or prior pregnancies except for tobacco use (1/2 ppd reported) and limited prenatal care. Prior to discharge a message was sent to Terri Herrera to schedule an initial prenatal appointment. Pt received return precautions for preterm labor, preeclampsia symptoms, and decreased fetal movement prior to discharge.  Consults: None  Significant Diagnostic Studies: Recent Results (from the past 2160 hour(s))  Urinalysis, Routine w reflex microscopic Urine, Clean Catch     Status: None   Collection Time: 01/06/20 11:15 AM  Result Value Ref Range   Color, Urine YELLOW YELLOW   APPearance CLEAR CLEAR   Specific Gravity, Urine 1.020 1.005 - 1.030   pH 6.5 5.0 - 8.0   Glucose, UA NEGATIVE NEGATIVE mg/dL   Hgb urine dipstick NEGATIVE NEGATIVE   Bilirubin Urine NEGATIVE NEGATIVE   Ketones, ur NEGATIVE NEGATIVE mg/dL   Protein, ur NEGATIVE NEGATIVE  mg/dL   Nitrite NEGATIVE NEGATIVE   Leukocytes,Ua NEGATIVE NEGATIVE    Comment: Microscopic not done on urines with negative protein, blood, leukocytes, nitrite, or glucose < 500 mg/dL. Performed at [facility], [address] [place], [place] [address]   Rapid urine drug screen (hospital performed)     Status: None   Collection Time: 01/06/20 11:27 AM  Result Value Ref Range   Opiates NONE DETECTED NONE DETECTED   Cocaine NONE DETECTED NONE DETECTED   Benzodiazepines NONE DETECTED NONE DETECTED   Amphetamines NONE DETECTED NONE DETECTED   Tetrahydrocannabinol NONE DETECTED NONE DETECTED   Barbiturates NONE DETECTED NONE DETECTED    Comment: (NOTE) DRUG SCREEN FOR MEDICAL PURPOSES ONLY.  IF CONFIRMATION IS NEEDED FOR ANY PURPOSE, NOTIFY LAB WITHIN 5 DAYS.  LOWEST DETECTABLE LIMITS FOR URINE DRUG SCREEN Drug Class                     Cutoff (ng/mL) Amphetamine and metabolites    1000 Barbiturate and metabolites    200 Benzodiazepine                 200 Tricyclics and metabolites     300 Opiates and metabolites        300 Cocaine and metabolites        300 THC                            50 Performed at [facility], [address], [place], [place] [address]  GC/Chlamydia probe amp ([place])not at [facility]     Status: None   Collection Time: 01/06/20 11:27 AM  Result Value Ref Range   Neisseria Gonorrhea Negative    Chlamydia Negative    Comment Normal Reference Ranger Chlamydia - Negative    Comment      Normal Reference Range Neisseria Gonorrhea - Negative  Wet prep, genital     Status: Abnormal   Collection Time: 01/06/20 11:36 AM   Specimen: PATH Cytology Cervicovaginal Ancillary Only  Result Value Ref Range   Yeast Wet Prep HPF POC NONE SEEN NONE SEEN   Trich, Wet Prep NONE SEEN NONE SEEN   Clue Cells Wet Prep HPF POC PRESENT (A) NONE SEEN   WBC, Wet Prep HPF POC MANY (A) NONE SEEN   Sperm NONE SEEN     Comment: Performed at [facility], [address] [place], [place] [address]  CBC with Differential/Platelet     Status: Abnormal   Collection Time: 01/06/20 11:55 AM  Result Value Ref Range   WBC 7.2 4.0 - 10.5 K/uL   RBC 2.74 (L) 3.87 - 5.11 MIL/uL   Hemoglobin 9.4 (L) 12.0 - 15.0 g/dL   HCT 47.827.9 (L) 29.536.0 - 62.146.0 %   MCV 101.8 (H) 80.0 - 100.0 fL   MCH 34.3 (H) 26.0 - 34.0 pg   MCHC 33.7 30.0 - 36.0 g/dL   RDW 30.812.4 65.711.5 - 84.615.5 %   Platelets 249 150 - 400 K/uL   nRBC 0.0 0.0 - 0.2 %   Neutrophils Relative % 74 %   Neutro Abs 5.3 1.7 - 7.7 K/uL   Lymphocytes Relative 18 %   Lymphs Abs 1.3 0.7 - 4.0 K/uL   Monocytes Relative 7 %   Monocytes Absolute 0.5 0.1 - 1.0 K/uL   Eosinophils Relative 1 %   Eosinophils Absolute 0.1 0.0 - 0.5 K/uL   Basophils Relative 0 %   Basophils Absolute 0.0 0.0 - 0.1 K/uL   Immature Granulocytes 0 %   Abs Immature Granulocytes 0.02 0.00 - 0.07 K/uL    Comment: Performed at [facility], [address] [place], [place] [address]  Comprehensive metabolic panel     Status: Abnormal   Collection Time: 01/06/20 11:55 AM  Result Value Ref Range   Sodium 136 135 - 145 mmol/L   Potassium 3.7 3.5 - 5.1 mmol/L   Chloride 106 98 - 111 mmol/L   CO2 20 (L) 22 - 32 mmol/L   Glucose, Bld 92 70 - 99 mg/dL    Comment: Glucose reference range applies only to samples taken after fasting for at least 8 hours.   BUN <5 (L) 6 - 20 mg/dL   Creatinine, Ser 2.840.49 0.44 - 1.00 mg/dL   Calcium 9.0 8.9 - 13.210.3 mg/dL   Total Protein 6.3 (L) 6.5 - 8.1 g/dL   Albumin 3.2 (L) 3.5 - 5.0 g/dL   AST 27 15 - 41 U/L   ALT 32 0 - 44 U/L   Alkaline Phosphatase 26 (L) 38 - 126 U/L   Total Bilirubin 0.8 0.3 - 1.2 mg/dL   GFR, Estimated >44>60 >01>60 mL/min    Comment: (NOTE) Calculated using the CKD-EPI Creatinine Equation (2021)    Anion gap 10 5 - 15    Comment: Performed at [facility], [address] [place], [place] [address]  Lipase, blood     Status: None   Collection Time: 01/06/20 11:55 AM  Result  Value Ref Range   Lipase 21 11 - 51 U/L    Comment: Performed at [facility], [address] [place], [place] [address]  Amylase     Status: None   Collection Time: 01/06/20 11:55 AM  Result Value Ref Range   Amylase 45 28 - 100 U/L    Comment: Performed at [facility], [address], [place], [place] [address]  Urinalysis, Routine w reflex microscopic Nasopharyngeal Swab     Status: Abnormal   Collection Time: 03/02/20  7:25 AM  Result Value Ref Range   Color, Urine YELLOW YELLOW   APPearance CLEAR CLEAR   Specific Gravity, Urine 1.013 1.005 - 1.030   pH 7.0 5.0 - 8.0   Glucose, UA NEGATIVE NEGATIVE mg/dL   Hgb urine dipstick NEGATIVE NEGATIVE   Bilirubin Urine NEGATIVE NEGATIVE   Ketones, ur NEGATIVE NEGATIVE mg/dL   Protein, ur NEGATIVE NEGATIVE mg/dL   Nitrite NEGATIVE NEGATIVE   Leukocytes,Ua NEGATIVE NEGATIVE   RBC / HPF 0-5 0 - 5 RBC/hpf   WBC, UA 0-5 0 - 5 WBC/hpf   Bacteria, UA RARE (A) NONE SEEN   Squamous Epithelial / LPF 0-5 0 - 5    Comment: Performed at [facility], [address], [place], [place] [address]  Resp Panel by RT-PCR (Flu A&B, Covid) Nasopharyngeal Swab     Status: None   Collection Time: 03/02/20  7:54 AM   Specimen: Nasopharyngeal Swab; Nasopharyngeal(NP) swabs in vial transport medium  Result Value Ref Range   SARS Coronavirus 2 by RT PCR NEGATIVE NEGATIVE    Comment: (NOTE) SARS-CoV-2 target nucleic acids are NOT DETECTED.  The SARS-CoV-2 RNA is generally detectable in upper respiratory specimens during the acute phase of infection. The lowest concentration of SARS-CoV-2 viral copies this assay can detect is 138 copies/mL. A negative result does not preclude SARS-Cov-2 infection and should not be used as the sole basis for treatment or other patient management decisions. A negative result may occur with  improper specimen collection/handling, submission of specimen other than nasopharyngeal swab, presence of viral  mutation(s) within the areas targeted by this assay, and inadequate number of viral copies(<138 copies/mL). A negative result must be combined with clinical observations, patient history, and epidemiological information. The expected result is Negative.  Fact Sheet for Patients:  BloggerCourse.com  Fact Sheet for Healthcare Providers:  SeriousBroker.it  This test is no t yet approved or cleared by the [place] FDA and  has been authorized for detection and/or diagnosis of SARS-CoV-2 by FDA under an Emergency Use Authorization (EUA). This EUA will remain  in effect (meaning this test can be used) for the duration of the COVID-19 declaration under Section 564(b)(1) of the Act, 21 U.S.C.section 360bbb-3(b)(1), unless the authorization is terminated  or revoked sooner.       Influenza A by PCR NEGATIVE NEGATIVE   Influenza B by PCR NEGATIVE NEGATIVE    Comment: (NOTE) The Xpert Xpress SARS-CoV-2/FLU/RSV plus assay is intended as an aid in the diagnosis of influenza from Nasopharyngeal swab specimens and should not be used as a sole basis for treatment. Nasal washings and aspirates are unacceptable for Xpert Xpress SARS-CoV-2/FLU/RSV testing.  Fact Sheet for Patients: BloggerCourse.com  Fact Sheet for Healthcare Providers: SeriousBroker.it  This test is not yet approved or cleared by the [place] FDA and has been authorized for detection and/or diagnosis of SARS-CoV-2 by FDA under an Emergency Use Authorization (EUA). This EUA will remain  in effect (meaning this test can be used) for the duration of the COVID-19 declaration under Section 564(b)(1) of the Act, 21 U.S.C. section 360bbb-3(b)(1), unless the authorization is terminated or revoked.  Performed at [facility], [address] [place], [place] [address]   Type and screen [facility]      Status: None   Collection Time: 03/02/20  8:40 AM  Result Value Ref Range   ABO/RH(D) O POS    Antibody Screen NEG    Sample Expiration      03/05/2020,2359 Performed at [facility], [address] [place], [place] [address]   CBC     Status: Abnormal   Collection Time: 03/02/20  8:47 AM  Result Value Ref Range   WBC 7.2 4.0 - 10.5 K/uL   RBC 2.83 (L) 3.87 - 5.11 MIL/uL   Hemoglobin 9.5 (L) 12.0 - 15.0 g/dL   HCT 78.4 (L) 69.6 - 29.5 %   MCV 101.1 (H) 80.0 - 100.0 fL   MCH 33.6 26.0 - 34.0 pg   MCHC 33.2 30.0 - 36.0 g/dL   RDW 28.4 13.2 - 44.0 %   Platelets 254 150 - 400 K/uL   nRBC 0.0 0.0 - 0.2 %    Comment: Performed at [facility], [address], [place], [place] [address]  RPR     Status: None   Collection Time: 03/02/20  8:47 AM  Result Value Ref Range   RPR Ser Ql NON REACTIVE NON REACTIVE    Comment: Performed at [facility], [address] [place], [place] [address]     Treatments: None  Discharge Exam: Blood pressure 108/62, pulse 83, temperature 98.2 F (36.8 C), temperature source Oral, resp. rate 15, height 5\' 3"  (1.6 m), weight 64 kg, last menstrual period 05/27/2019, SpO2 99 %, unknown if currently breastfeeding. General appearance: alert, cooperative and appears stated age Head: Normocephalic, without obvious abnormality, atraumatic Eyes: conjunctivae/corneas clear. EOMI. Neck: supple Resp: normal WOB Cardio: regular rate GI: soft, non-tender Pelvic: 2.5/thick/high per nursing exam in MAU Extremities: extremities normal, atraumatic, no cyanosis or edema Neurologic: Grossly normal  Disposition:  Discharge disposition: 01-Home or Self Care        Allergies as of 03/02/2020      Reactions   Acetaminophen Itching   No reaction; pt prefers not to take      Medication List    STOP taking these medications   Comfort Fit Maternity Supp Med Misc   metroNIDAZOLE 0.75 % vaginal gel Commonly known as: METROGEL VAGINAL      TAKE these medications   ferrous sulfate 325 (65 FE) MG tablet Take 1 tablet (325 mg total) by mouth every other day.   Prenatal Vitamin 27-0.8 MG Tabs Take 1 tablet by mouth at bedtime.   sennosides-docusate sodium 8.6-50 MG tablet Commonly known as: SENOKOT-S Take 1 tablet by mouth daily.       Follow-up Information    [facility] Follow up in 1 week(s).   Specialty: Obstetrics and Gynecology Contact information: [address] [place] [phone]              Signed: [phone], MD OB Fellow, Faculty Practice 03/02/2020 11:40 AM

## 2020-03-02 NOTE — Discharge Instructions (Signed)
Fetal Movement Counts Patient Name: ________________________________________________ Patient Due Date: ____________________ What is a fetal movement count?  A fetal movement count is the number of times that you feel your baby move during a certain amount of time. This may also be called a fetal kick count. A fetal movement count is recommended for every pregnant woman. You may be asked to start counting fetal movements as early as week 28 of your pregnancy. Pay attention to when your baby is most active. You may notice your baby's sleep and wake cycles. You may also notice things that make your baby move more. You should do a fetal movement count:  When your baby is normally most active.  At the same time each day. A good time to count movements is while you are resting, after having something to eat and drink. How do I count fetal movements? 1. Find a quiet, comfortable area. Sit, or lie down on your side. 2. Write down the date, the start time and stop time, and the number of movements that you felt between those two times. Take this information with you to your health care visits. 3. Write down your start time when you feel the first movement. 4. Count kicks, flutters, swishes, rolls, and jabs. You should feel at least 10 movements. 5. You may stop counting after you have felt 10 movements, or if you have been counting for 2 hours. Write down the stop time. 6. If you do not feel 10 movements in 2 hours, contact your health care provider for further instructions. Your health care provider may want to do additional tests to assess your baby's well-being. Contact a health care provider if:  You feel fewer than 10 movements in 2 hours.  Your baby is not moving like he or she usually does. Date: ____________ Start time: ____________ Stop time: ____________ Movements: ____________ Date: ____________ Start time: ____________ Stop time: ____________ Movements: ____________ Date: ____________  Start time: ____________ Stop time: ____________ Movements: ____________ Date: ____________ Start time: ____________ Stop time: ____________ Movements: ____________ Date: ____________ Start time: ____________ Stop time: ____________ Movements: ____________ Date: ____________ Start time: ____________ Stop time: ____________ Movements: ____________ Date: ____________ Start time: ____________ Stop time: ____________ Movements: ____________ Date: ____________ Start time: ____________ Stop time: ____________ Movements: ____________ Date: ____________ Start time: ____________ Stop time: ____________ Movements: ____________ This information is not intended to replace advice given to you by your health care provider. Make sure you discuss any questions you have with your health care provider. Document Revised: 10/16/2018 Document Reviewed: 10/16/2018 Elsevier Patient Education  2020 [employer]. Based on your ultrasound on admission to Labor and Delivery, your gestational age today is 34 weeks 1 day.  It will be very important to establish care with the [facility]. You will receive a call to schedule an initial prenatal appointment. Please call sooner if concern of labor, decreased fetal movement, severe headache or other concerns.  Third Trimester of Pregnancy The third trimester is from week 28 through week 40 (months 7 through 9). The third trimester is a time when the unborn baby (fetus) is growing rapidly. At the end of the ninth month, the fetus is about 20 inches in length and weighs 6-10 pounds. Body changes during your third trimester Your body will continue to go through many changes during pregnancy. The changes vary from woman to woman. During the third trimester:  Your weight will continue to increase. You can expect to gain 25-35 pounds (11-16 kg) by the  end of the pregnancy.  You may begin to get stretch marks on your hips, abdomen, and breasts.  You may urinate more often  because the fetus is moving lower into your pelvis and pressing on your bladder.  You may develop or continue to have heartburn. This is caused by increased hormones that slow down muscles in the digestive tract.  You may develop or continue to have constipation because increased hormones slow digestion and cause the muscles that push waste through your intestines to relax.  You may develop hemorrhoids. These are swollen veins (varicose veins) in the rectum that can itch or be painful.  You may develop swollen, bulging veins (varicose veins) in your legs.  You may have increased body aches in the pelvis, back, or thighs. This is due to weight gain and increased hormones that are relaxing your joints.  You may have changes in your hair. These can include thickening of your hair, rapid growth, and changes in texture. Some women also have hair loss during or after pregnancy, or hair that feels dry or thin. Your hair will most likely return to normal after your baby is born.  Your breasts will continue to grow and they will continue to become tender. A yellow fluid (colostrum) may leak from your breasts. This is the first milk you are producing for your baby.  Your belly button may stick out.  You may notice more swelling in your hands, face, or ankles.  You may have increased tingling or numbness in your hands, arms, and legs. The skin on your belly may also feel numb.  You may feel short of breath because of your expanding uterus.  You may have more problems sleeping. This can be caused by the size of your belly, increased need to urinate, and an increase in your body's metabolism.  You may notice the fetus "dropping," or moving lower in your abdomen (lightening).  You may have increased vaginal discharge.  You may notice your joints feel loose and you may have pain around your pelvic bone. What to expect at prenatal visits You will have prenatal exams every 2 weeks until week 36. Then  you will have weekly prenatal exams. During a routine prenatal visit:  You will be weighed to make sure you and the baby are growing normally.  Your blood pressure will be taken.  Your abdomen will be measured to track your baby's growth.  The fetal heartbeat will be listened to.  Any test results from the previous visit will be discussed.  You may have a cervical check near your due date to see if your cervix has softened or thinned (effaced).  You will be tested for Group B streptococcus. This happens between 35 and 37 weeks. Your health care provider may ask you:  What your birth plan is.  How you are feeling.  If you are feeling the baby move.  If you have had any abnormal symptoms, such as leaking fluid, bleeding, severe headaches, or abdominal cramping.  If you are using any tobacco products, including cigarettes, chewing tobacco, and electronic cigarettes.  If you have any questions. Other tests or screenings that may be performed during your third trimester include:  Blood tests that check for low iron levels (anemia).  Fetal testing to check the health, activity level, and growth of the fetus. Testing is done if you have certain medical conditions or if there are problems during the pregnancy.  Nonstress test (NST). This test checks the health of  your baby to make sure there are no signs of problems, such as the baby not getting enough oxygen. During this test, a belt is placed around your belly. The baby is made to move, and its heart rate is monitored during movement. What is false labor? False labor is a condition in which you feel small, irregular tightenings of the muscles in the womb (contractions) that usually go away with rest, changing position, or drinking water. These are called Braxton Hicks contractions. Contractions may last for hours, days, or even weeks before true labor sets in. If contractions come at regular intervals, become more frequent, increase in  intensity, or become painful, you should see your health care provider. What are the signs of labor?  Abdominal cramps.  Regular contractions that start at 10 minutes apart and become stronger and more frequent with time.  Contractions that start on the top of the uterus and spread down to the lower abdomen and back.  Increased pelvic pressure and dull back pain.  A watery or bloody mucus discharge that comes from the vagina.  Leaking of amniotic fluid. This is also known as your "water breaking." It could be a slow trickle or a gush. Let your health care provider know if it has a color or strange odor. If you have any of these signs, call your health care provider right away, even if it is before your due date. Follow these instructions at home: Medicines  Follow your health care provider's instructions regarding medicine use. Specific medicines may be either safe or unsafe to take during pregnancy.  Take a prenatal vitamin that contains at least 600 micrograms (mcg) of folic acid.  If you develop constipation, try taking a stool softener if your health care provider approves. Eating and drinking   Eat a balanced diet that includes fresh fruits and vegetables, whole grains, good sources of protein such as meat, eggs, or tofu, and low-fat dairy. Your health care provider will help you determine the amount of weight gain that is right for you.  Avoid raw meat and uncooked cheese. These carry germs that can cause birth defects in the baby.  If you have low calcium intake from food, talk to your health care provider about whether you should take a daily calcium supplement.  Eat four or five small meals rather than three large meals a day.  Limit foods that are high in fat and processed sugars, such as fried and sweet foods.  To prevent constipation: ? Drink enough fluid to keep your urine clear or pale yellow. ? Eat foods that are high in fiber, such as fresh fruits and vegetables,  whole grains, and beans. Activity  Exercise only as directed by your health care provider. Most women can continue their usual exercise routine during pregnancy. Try to exercise for 30 minutes at least 5 days a week. Stop exercising if you experience uterine contractions.  Avoid heavy lifting.  Do not exercise in extreme heat or humidity, or at high altitudes.  Wear low-heel, comfortable shoes.  Practice good posture.  You may continue to have sex unless your health care provider tells you otherwise. Relieving pain and discomfort  Take frequent breaks and rest with your legs elevated if you have leg cramps or low back pain.  Take warm sitz baths to soothe any pain or discomfort caused by hemorrhoids. Use hemorrhoid cream if your health care provider approves.  Wear a good support bra to prevent discomfort from breast tenderness.  If you  develop varicose veins: ? Wear support pantyhose or compression stockings as told by your healthcare provider. ? Elevate your feet for 15 minutes, 3-4 times a day. Prenatal care  Write down your questions. Take them to your prenatal visits.  Keep all your prenatal visits as told by your health care provider. This is important. Safety  Wear your seat belt at all times when driving.  Make a list of emergency phone numbers, including numbers for family, friends, the hospital, and police and fire departments. General instructions  Avoid cat litter boxes and soil used by cats. These carry germs that can cause birth defects in the baby. If you have a cat, ask someone to clean the litter box for you.  Do not travel far distances unless it is absolutely necessary and only with the approval of your health care provider.  Do not use hot tubs, steam rooms, or saunas.  Do not drink alcohol.  Do not use any products that contain nicotine or tobacco, such as cigarettes and e-cigarettes. If you need help quitting, ask your health care provider.  Do not  use any medicinal herbs or unprescribed drugs. These chemicals affect the formation and growth of the baby.  Do not douche or use tampons or scented sanitary pads.  Do not cross your legs for long periods of time.  To prepare for the arrival of your baby: ? Take prenatal classes to understand, practice, and ask questions about labor and delivery. ? Make a trial run to the hospital. ? Visit the hospital and tour the maternity area. ? Arrange for maternity or paternity leave through employers. ? Arrange for family and friends to take care of pets while you are in the hospital. ? Purchase a rear-facing car seat and make sure you know how to install it in your car. ? Pack your hospital bag. ? Prepare the baby's nursery. Make sure to remove all pillows and stuffed animals from the baby's crib to prevent suffocation.  Visit your dentist if you have not gone during your pregnancy. Use a soft toothbrush to brush your teeth and be gentle when you floss. Contact a health care provider if:  You are unsure if you are in labor or if your water has broken.  You become dizzy.  You have mild pelvic cramps, pelvic pressure, or nagging pain in your abdominal area.  You have lower back pain.  You have persistent nausea, vomiting, or diarrhea.  You have an unusual or bad smelling vaginal discharge.  You have pain when you urinate. Get help right away if:  Your water breaks before 37 weeks.  You have regular contractions less than 5 minutes apart before 37 weeks.  You have a fever.  You are leaking fluid from your vagina.  You have spotting or bleeding from your vagina.  You have severe abdominal pain or cramping.  You have rapid weight loss or weight gain.  You have shortness of breath with chest pain.  You notice sudden or extreme swelling of your face, hands, ankles, feet, or legs.  Your baby makes fewer than 10 movements in 2 hours.  You have severe headaches that do not go away  when you take medicine.  You have vision changes. Summary  The third trimester is from week 28 through week 40, months 7 through 9. The third trimester is a time when the unborn baby (fetus) is growing rapidly.  During the third trimester, your discomfort may increase as you and your baby continue  to gain weight. You may have abdominal, leg, and back pain, sleeping problems, and an increased need to urinate.  During the third trimester your breasts will keep growing and they will continue to become tender. A yellow fluid (colostrum) may leak from your breasts. This is the first milk you are producing for your baby.  False labor is a condition in which you feel small, irregular tightenings of the muscles in the womb (contractions) that eventually go away. These are called Braxton Hicks contractions. Contractions may last for hours, days, or even weeks before true labor sets in.  Signs of labor can include: abdominal cramps; regular contractions that start at 10 minutes apart and become stronger and more frequent with time; watery or bloody mucus discharge that comes from the vagina; increased pelvic pressure and dull back pain; and leaking of amniotic fluid. This information is not intended to replace advice given to you by your health care provider. Make sure you discuss any questions you have with your health care provider. Document Revised: 06/19/2018 Document Reviewed: 04/03/2016 Elsevier Patient Education  2020 [employer]   Fetal Movement Counts  What is a fetal movement count?  A fetal movement count is the number of times that you feel your baby move during a certain amount of time. This may also be called a fetal kick count. A fetal movement count is recommended for every pregnant woman. You may be asked to start counting fetal movements as early as week 28 of your pregnancy. Pay attention to when your baby is most active. You may notice your baby's sleep and wake cycles. You may  also notice things that make your baby move more. You should do a fetal movement count:  When your baby is normally most active.  At the same time each day. A good time to count movements is while you are resting, after having something to eat and drink. How do I count fetal movements? 7. Find a quiet, comfortable area. Sit, or lie down on your side. 8. Write down the date, the start time and stop time, and the number of movements that you felt between those two times. Take this information with you to your health care visits. 9. Write down your start time when you feel the first movement. 10. Count kicks, flutters, swishes, rolls, and jabs. You should feel at least 10 movements. 11. You may stop counting after you have felt 10 movements, or if you have been counting for 2 hours. Write down the stop time. 12. If you do not feel 10 movements in 2 hours, contact your health care provider for further instructions. Your health care provider may want to do additional tests to assess your baby's well-being. Contact a health care provider if:  You feel fewer than 10 movements in 2 hours.  Your baby is not moving like he or she usually does. Date: ____________ Start time: ____________ Stop time: ____________ Movements: ____________ Date: ____________ Start time: ____________ Stop time: ____________ Movements: ____________ Date: ____________ Start time: ____________ Stop time: ____________ Movements: ____________ Date: ____________ Start time: ____________ Stop time: ____________ Movements: ____________ Date: ____________ Start time: ____________ Stop time: ____________ Movements: ____________ Date: ____________ Start time: ____________ Stop time: ____________ Movements: ____________ Date: ____________ Start time: ____________ Stop time: ____________ Movements: ____________ Date: ____________ Start time: ____________ Stop time: ____________ Movements: ____________ Date: ____________ Start time:  ____________ Stop time: ____________ Movements: ____________ This information is not intended to replace advice given to you by your health care  provider. Make sure you discuss any questions you have with your health care provider. Document Revised: 10/16/2018 Document Reviewed: 10/16/2018 Elsevier Patient Education  2020 [employer].

## 2020-03-02 NOTE — MAU Note (Addendum)
PT SAYS LAST TIME FELT BABY MOVE WAS 730PM-  [facility] - IN TRIAGE -134.  FEELS  UC STRONG SINCE 10PM. PNC WITH  CLINIC- STOPPED TAKING MEDCAID .  LAST TIME [facility]- LAST TIME WAS HERE IN MAU  ON 10-27.    DENIES HSV AND MRSA.

## 2020-03-02 NOTE — H&P (Signed)
OBSTETRIC ADMISSION HISTORY AND PHYSICAL  Terri Herrera is a 29 y.o. female [phone] with IUP at [redacted]w[redacted]d by ultrasound on day of admission. She presented for decreased fetal movement which improved s/p arrival to L&D. She reports +FMs, No LOF, no VB, no blurry vision, headaches or peripheral edema, and RUQ pain.  She plans on bottle feeding. She requests Paraguard IUD for birth control.  She received her prenatal care at: None  Dating: By 34w ultrasound --->  Estimated Date of Delivery: 04/12/20  Sono:    @[redacted]w[redacted]d , CWD, normal anatomy, cephalic presentation, 2500g, EFW  Prenatal History/Complications:  -tobacco use in pregnancy -limited prenatal care  Past Medical History: Past Medical History:  Diagnosis Date  . Alcohol use affecting pregnancy   . Bronchitis   . Gonorrhea   . Vaginal Pap smear, abnormal     Past Surgical History: Past Surgical History:  Procedure Laterality Date  . broken knee May 2021 right    . WISDOM TOOTH EXTRACTION      Obstetrical History: OB History    Gravida  6   Para  2   Term  2   Preterm      AB  3   Living  2     SAB  3   IAB      Ectopic      Multiple  0   Live Births  2        Obstetric Comments  Admitted ETOH daily and tobacco use during pregnancy        Social History Social History   Socioeconomic History  . Marital status: Single    Spouse name: Not on file  . Number of children: Not on file  . Years of education: Not on file  . Highest education level: Not on file  Occupational History  . Not on file  Tobacco Use  . Smoking status: Light Tobacco Smoker    Packs/day: 0.25    Types: Cigarettes  . Smokeless tobacco: Never Used  . Tobacco comment: NON- MENTHOL  Vaping Use  . Vaping Use: Never used  Substance and Sexual Activity  . Alcohol use: Yes    Alcohol/week: 72.0 standard drinks    Types: 72 Cans of beer per week    Comment: occasional  . Drug use: Not Currently  . Sexual activity: Yes     Birth control/protection: None    Comment: pregnant  Other Topics Concern  . Not on file  Social History Narrative  . Not on file   Social Determinants of Health   Financial Resource Strain: Not on file  Food Insecurity: Not on file  Transportation Needs: Not on file  Physical Activity: Not on file  Stress: Not on file  Social Connections: Not on file    Family History: Family History  Problem Relation Age of Onset  . Cancer Paternal Grandmother   . Asthma Mother   . Diabetes Mother   . Alcohol abuse Mother   . Cancer Father   . Cancer Paternal Uncle     Allergies: Allergies  Allergen Reactions  . Acetaminophen Itching    No reaction; pt prefers not to take    Medications Prior to Admission  Medication Sig Dispense Refill Last Dose  . Prenatal Vit-Fe Fumarate-FA (PRENATAL VITAMIN) 27-0.8 MG TABS Take 1 tablet by mouth at bedtime. 30 tablet 4 03/01/2020 at Unknown time  . Elastic Bandages & Supports (COMFORT FIT MATERNITY SUPP MED) MISC Wear belt during the day,  as needed, and remove at night prior to bedtime. 1 each 0   . metroNIDAZOLE (METROGEL VAGINAL) 0.75 % vaginal gel Place 1 Applicatorful vaginally at bedtime. Insert one applicator, at bedtime, for 5 nights. 70 g 0   . sennosides-docusate sodium (SENOKOT-S) 8.6-50 MG tablet Take 1 tablet by mouth daily. 30 tablet 1 More than a month at Unknown time    Review of Systems   All systems reviewed and negative except as stated in HPI  Blood pressure 108/62, pulse 83, temperature 98.2 F (36.8 C), temperature source Oral, resp. rate 15, height 5\' 3"  (1.6 m), weight 64 kg, last menstrual period 05/27/2019, SpO2 99 %, unknown if currently breastfeeding. General appearance: alert, cooperative and appears stated age Lungs: normal WOB Heart: regular rate  Abdomen: soft, non-tender Extremities:  no sign of DVT Presentation: cephalic on formal ultrasound Fetal monitoringBaseline: 135 bpm, Variability: Good {> 6 bpm),  Accelerations: Reactive and Decelerations: Absent Uterine activityNone Dilation: 2.5 Effacement (%): Thick Station: Ballotable Exam by:: [email or web address], RN   Prenatal labs: ABO, Rh: --/--/O POS (12/22 0840) Antibody: NEG (12/22 0840) Rubella: 3.96 (07/19 1600) RPR: NON REACTIVE (12/22 0847)  HBsAg: NON REACTIVE (07/19 1600)  HIV: Non Reactive (07/19 1600)  GBS:    1 hr Glucola: not performed Genetic screening: not performed Anatomy 03-12-1991: not performed (first ultrasound on admission @[redacted]w[redacted]d  within normal limits)  Prenatal Transfer Tool  Maternal Diabetes: No but no diabetes screening in pregnancy Genetic Screening: not performed Maternal Ultrasounds/Referrals: Normal at [redacted]w[redacted]d Fetal Ultrasounds or other Referrals:  None Maternal Substance Abuse:  Yes:  Type: Smoker Significant Maternal Medications:  None Significant Maternal Lab Results: None  Results for orders placed or performed during the hospital encounter of 03/02/20 (from the past 24 hour(s))  Urinalysis, Routine w reflex microscopic Nasopharyngeal Swab   Collection Time: 03/02/20  7:25 AM  Result Value Ref Range   Color, Urine YELLOW YELLOW   APPearance CLEAR CLEAR   Specific Gravity, Urine 1.013 1.005 - 1.030   pH 7.0 5.0 - 8.0   Glucose, UA NEGATIVE NEGATIVE mg/dL   Hgb urine dipstick NEGATIVE NEGATIVE   Bilirubin Urine NEGATIVE NEGATIVE   Ketones, ur NEGATIVE NEGATIVE mg/dL   Protein, ur NEGATIVE NEGATIVE mg/dL   Nitrite NEGATIVE NEGATIVE   Leukocytes,Ua NEGATIVE NEGATIVE   RBC / HPF 0-5 0 - 5 RBC/hpf   WBC, UA 0-5 0 - 5 WBC/hpf   Bacteria, UA RARE (A) NONE SEEN   Squamous Epithelial / LPF 0-5 0 - 5  Resp Panel by RT-PCR (Flu A&B, Covid) Nasopharyngeal Swab   Collection Time: 03/02/20  7:54 AM   Specimen: Nasopharyngeal Swab; Nasopharyngeal(NP) swabs in vial transport medium  Result Value Ref Range   SARS Coronavirus 2 by RT PCR NEGATIVE NEGATIVE   Influenza A by PCR NEGATIVE NEGATIVE   Influenza B by PCR  NEGATIVE NEGATIVE  Type and screen [facility]   Collection Time: 03/02/20  8:40 AM  Result Value Ref Range   ABO/RH(D) O POS    Antibody Screen NEG    Sample Expiration      03/05/2020,2359 Performed at [facility], [address] [place], [address] [place]   CBC   Collection Time: 03/02/20  8:47 AM  Result Value Ref Range   WBC 7.2 4.0 - 10.5 K/uL   RBC 2.83 (L) 3.87 - 5.11 MIL/uL   Hemoglobin 9.5 (L) 12.0 - 15.0 g/dL   HCT 42683 (L) 03/04/20 - 41.9 %  MCV 101.1 (H) 80.0 - 100.0 fL   MCH 33.6 26.0 - 34.0 pg   MCHC 33.2 30.0 - 36.0 g/dL   RDW 97.0 26.3 - 78.5 %   Platelets 254 150 - 400 K/uL   nRBC 0.0 0.0 - 0.2 %  RPR   Collection Time: 03/02/20  8:47 AM  Result Value Ref Range   RPR Ser Ql NON REACTIVE NON REACTIVE    Patient Active Problem List   Diagnosis Date Noted  . Indication for care in labor or delivery 03/02/2020  . Decreased fetal movement affecting management of pregnancy in third trimester 03/02/2020  . [redacted] weeks gestation of pregnancy 03/02/2020  . Tobacco use in pregnancy, antepartum 03/02/2020  . Limited prenatal care 03/02/2020  . Anemia in pregnancy, third trimester 03/02/2020  . Supervision of other normal pregnancy, antepartum 10/02/2019  . NST (non-stress test) with decelerations 04/30/2018  . Alcohol abuse affecting pregnancy 03/28/2018  . Tobacco abuse 03/28/2018  . Supervision of high risk pregnancy, antepartum 11/15/2017  . History of prior pregnancy with SGA newborn 11/15/2017  . Foot pain, right 06/04/2016  . Plantar wart of right foot 06/04/2016    Assessment/Plan:  AJAYLA IGLESIAS is a 28 y.o. [id number] at [redacted]w[redacted]d here for concern of decreased fetal movement. On arrival to MAU, pt thought to be at [redacted]w[redacted]d, but based on ultrasound performed on arrival to L&D, pt found to actually be dated at [redacted]w[redacted]d.  #Decreased Fetal Movement  Preterm: Not currently in labor. Given updated dating of [redacted]w[redacted]d based on ultrasound today and  improvement in fetal movement with reactive NST, will plan for discharge and close f/u in clinic. #FWB: Reactive NST on admission & fetal movement improved s/p admission to L&D #ID: GBS unknown >plan for GBS swab in clinic s/p 36w #MOF: bottle #MOC: desires Paraguard IUD #Circ:  N/a  #Anemia: Hgb 9.5 on admission. Will start ferrous sulfate every other day. #Tobacco Use: pt reports 1/2 ppd. Pt motivated to cut back. Praised pt for effort to cut back tobacco use and counseled on importance of complete cessation. #Limited PNC: pt reports issues with Medicaid as reason for not establishing prenatal care. Will plan for SW consult in postpartum period and close f/u in clinic at [facility].  Sheila Oats, MD OB Fellow, Faculty Practice 03/02/2020 11:31 AM

## 2020-03-11 ENCOUNTER — Telehealth: Payer: Self-pay | Admitting: Obstetrics and Gynecology

## 2020-03-11 DIAGNOSIS — O368130 Decreased fetal movements, third trimester, not applicable or unspecified: Secondary | ICD-10-CM

## 2020-03-11 NOTE — Telephone Encounter (Signed)
Order placed for f/u growth ultrasound given recs per MFM. Sent message to schedulers for ultrasound and weekly NSTs. Pt has f/u appt with Dr. Debroah Loop on 03/16/20.  Sheila Oats, MD OB Fellow, Faculty Practice 03/11/2020 10:37 AM

## 2020-03-12 NOTE — L&D Delivery Note (Signed)
Delivery Note Called to bedside due to RN having difficulty finding FHT. Patient found to be 9/100/0. FSE placed and FHR 70-80s. Dr. Vergie Living reported to bedside and patient was rechecked and found to have a reducible cervical lip. Pushing was initiated at that time. At 3:40 AM a viable female was delivered via Vaginal, Spontaneous (Presentation: Left Occiput Anterior).  APGAR: 9, 9; weight 2495g.  Shoulder cord noted at delivery, delivered through without difficulty. Placenta status: Spontaneous, Intact.  Cord: 3 vessels with the following complications: None. Post placental liletta was placed without difficulty, please refer to procedure note for further details.  Anesthesia: Epidural Episiotomy: None Lacerations: None Est. Blood Loss (mL): 50  Mom to postpartum.  Baby to Couplet care / Skin to Skin.  Alric Seton 03/21/2020, 4:08 AM

## 2020-03-16 ENCOUNTER — Ambulatory Visit (INDEPENDENT_AMBULATORY_CARE_PROVIDER_SITE_OTHER): Payer: Medicaid Other | Admitting: *Deleted

## 2020-03-16 ENCOUNTER — Other Ambulatory Visit (HOSPITAL_COMMUNITY)
Admission: RE | Admit: 2020-03-16 | Discharge: 2020-03-16 | Disposition: A | Discharge status: Home / Self Care | Payer: Medicaid Other | Source: Ambulatory Visit | Attending: Obstetrics & Gynecology | Admitting: Obstetrics & Gynecology

## 2020-03-16 ENCOUNTER — Ambulatory Visit (INDEPENDENT_AMBULATORY_CARE_PROVIDER_SITE_OTHER): Payer: Medicaid Other | Admitting: Obstetrics & Gynecology

## 2020-03-16 ENCOUNTER — Other Ambulatory Visit: Payer: Self-pay

## 2020-03-16 VITALS — BP 108/66 | HR 91 | Wt 141.7 lb

## 2020-03-16 DIAGNOSIS — O0990 Supervision of high risk pregnancy, unspecified, unspecified trimester: Secondary | ICD-10-CM | POA: Insufficient documentation

## 2020-03-16 DIAGNOSIS — O0993 Supervision of high risk pregnancy, unspecified, third trimester: Secondary | ICD-10-CM | POA: Diagnosis not present

## 2020-03-16 DIAGNOSIS — O0930 Supervision of pregnancy with insufficient antenatal care, unspecified trimester: Secondary | ICD-10-CM

## 2020-03-16 DIAGNOSIS — O0933 Supervision of pregnancy with insufficient antenatal care, third trimester: Secondary | ICD-10-CM

## 2020-03-16 DIAGNOSIS — Z3480 Encounter for supervision of other normal pregnancy, unspecified trimester: Secondary | ICD-10-CM

## 2020-03-16 DIAGNOSIS — Z3A36 36 weeks gestation of pregnancy: Secondary | ICD-10-CM | POA: Diagnosis not present

## 2020-03-16 NOTE — Patient Instructions (Signed)

## 2020-03-16 NOTE — Progress Notes (Signed)
Subjective:No prenatal care    Terri Herrera is a [id number] [redacted]w[redacted]d being seen today for her first obstetrical visit.  Her obstetrical history is significant for inadequate prenatal care. Patient does intend to breast feed. Pregnancy history fully reviewed.  Patient reports occasional contractions.  Vitals:   03/16/20 0942  BP: 108/66  Pulse: 91  Weight: 141 lb 11.2 oz (64.3 kg)    HISTORY: OB History  Gravida Para Term Preterm AB Living  6 2 2   3 2   SAB IAB Ectopic Multiple Live Births  3     0 2    # Outcome Date GA Lbr Len/2nd Weight Sex Delivery Anes PTL Lv  6 Current           5 Term 04/30/18 [redacted]w[redacted]d 04:15 / 00:04 5 lb 1.8 oz (2.319 kg) F Vag-Spont EPI  LIV  4 Term 05/24/14 [redacted]w[redacted]d 14:28 / 00:35 4 lb 15 oz (2.24 kg) F Vag-Spont EPI  LIV  3 SAB 2014          2 SAB 2008          1 SAB 2008             Birth Comments: System Generated. Please review and update pregnancy details.    Obstetric Comments  Admitted ETOH daily and tobacco use during pregnancy   Past Medical History:  Diagnosis Date  . Alcohol use affecting pregnancy   . Bronchitis   . Gonorrhea   . Vaginal Pap smear, abnormal    Past Surgical History:  Procedure Laterality Date  . broken knee May 2021 right    . WISDOM TOOTH EXTRACTION     Family History  Problem Relation Age of Onset  . Cancer Paternal Grandmother   . Asthma Mother   . Diabetes Mother   . Alcohol abuse Mother   . Cancer Father   . Cancer Paternal Uncle      Exam    Uterus:     Pelvic Exam:    Perineum: No Hemorrhoids   Vulva: normal   Vagina:  normal mucosa   pH:     Cervix: no lesions   Adnexa: not evaluated   Bony Pelvis: average       Skin: normal coloration and turgor, no rashes    Neurologic: oriented, normal mood   Extremities: normal strength, tone, and muscle mass   HEENT PERRLA and neck supple with midline trachea       Neck supple   Cardiovascular: regular rate and rhythm   Respiratory:  appears well,  vitals normal, no respiratory distress, acyanotic, normal RR   Abdomen: gravid   Urinary: urethral meatus normal      Assessment:    Pregnancy: June 2021 Patient Active Problem List   Diagnosis Date Noted  . Indication for care in labor or delivery 03/02/2020  . Decreased fetal movement affecting management of pregnancy in third trimester 03/02/2020  . [redacted] weeks gestation of pregnancy 03/02/2020  . Tobacco use in pregnancy, antepartum 03/02/2020  . Limited prenatal care 03/02/2020  . Anemia in pregnancy, third trimester 03/02/2020  . Supervision of other normal pregnancy, antepartum 10/02/2019  . NST (non-stress test) with decelerations 04/30/2018  . Alcohol abuse affecting pregnancy 03/28/2018  . Tobacco abuse 03/28/2018  . History of prior pregnancy with SGA newborn 11/15/2017  . Foot pain, right 06/04/2016  . Plantar wart of right foot 06/04/2016        Plan:     Initial labs drawn.  Prenatal vitamins.  Problem list reviewed and updated.  Ultrasound discussed; fetal survey: results reviewed.  Follow up in 1 weeks. 50% of 30 min visit spent on counseling and coordination of care.     Scheryl Darter 03/16/2020

## 2020-03-17 ENCOUNTER — Encounter: Payer: Self-pay | Admitting: *Deleted

## 2020-03-17 LAB — GC/CHLAMYDIA PROBE AMP (~~LOC~~) NOT AT ARMC
Chlamydia: NEGATIVE
Comment: NEGATIVE
Comment: NORMAL
Neisseria Gonorrhea: NEGATIVE

## 2020-03-17 LAB — HIV ANTIBODY (ROUTINE TESTING W REFLEX): HIV Screen 4th Generation wRfx: NONREACTIVE

## 2020-03-18 LAB — STREP GP B NAA: Strep Gp B NAA: NEGATIVE

## 2020-03-18 LAB — CULTURE, OB URINE

## 2020-03-18 LAB — URINE CULTURE, OB REFLEX

## 2020-03-20 ENCOUNTER — Inpatient Hospital Stay (HOSPITAL_BASED_OUTPATIENT_CLINIC_OR_DEPARTMENT_OTHER): Payer: Medicaid Other

## 2020-03-20 ENCOUNTER — Encounter (HOSPITAL_COMMUNITY): Payer: Self-pay | Admitting: Obstetrics and Gynecology

## 2020-03-20 ENCOUNTER — Other Ambulatory Visit: Payer: Self-pay

## 2020-03-20 ENCOUNTER — Inpatient Hospital Stay (HOSPITAL_COMMUNITY)
Admission: AD | Admit: 2020-03-20 | Discharge: 2020-03-23 | DRG: 805 | Disposition: A | Discharge status: Home / Self Care | Payer: Medicaid Other | Attending: Obstetrics and Gynecology | Admitting: Obstetrics and Gynecology

## 2020-03-20 DIAGNOSIS — O9902 Anemia complicating childbirth: Secondary | ICD-10-CM | POA: Diagnosis present

## 2020-03-20 DIAGNOSIS — Z3A36 36 weeks gestation of pregnancy: Secondary | ICD-10-CM

## 2020-03-20 DIAGNOSIS — O4700 False labor before 37 completed weeks of gestation, unspecified trimester: Secondary | ICD-10-CM | POA: Diagnosis present

## 2020-03-20 DIAGNOSIS — O288 Other abnormal findings on antenatal screening of mother: Secondary | ICD-10-CM | POA: Diagnosis not present

## 2020-03-20 DIAGNOSIS — Z720 Tobacco use: Secondary | ICD-10-CM | POA: Diagnosis present

## 2020-03-20 DIAGNOSIS — O99334 Smoking (tobacco) complicating childbirth: Secondary | ICD-10-CM | POA: Diagnosis present

## 2020-03-20 DIAGNOSIS — O0930 Supervision of pregnancy with insufficient antenatal care, unspecified trimester: Secondary | ICD-10-CM

## 2020-03-20 DIAGNOSIS — O99330 Smoking (tobacco) complicating pregnancy, unspecified trimester: Secondary | ICD-10-CM | POA: Diagnosis present

## 2020-03-20 DIAGNOSIS — Z3480 Encounter for supervision of other normal pregnancy, unspecified trimester: Secondary | ICD-10-CM

## 2020-03-20 DIAGNOSIS — Z3689 Encounter for other specified antenatal screening: Secondary | ICD-10-CM

## 2020-03-20 DIAGNOSIS — Z8759 Personal history of other complications of pregnancy, childbirth and the puerperium: Secondary | ICD-10-CM

## 2020-03-20 DIAGNOSIS — F17210 Nicotine dependence, cigarettes, uncomplicated: Secondary | ICD-10-CM | POA: Diagnosis present

## 2020-03-20 DIAGNOSIS — O9852 Other viral diseases complicating childbirth: Secondary | ICD-10-CM | POA: Diagnosis present

## 2020-03-20 DIAGNOSIS — Z30430 Encounter for insertion of intrauterine contraceptive device: Secondary | ICD-10-CM

## 2020-03-20 DIAGNOSIS — D649 Anemia, unspecified: Secondary | ICD-10-CM | POA: Diagnosis present

## 2020-03-20 DIAGNOSIS — Z886 Allergy status to analgesic agent status: Secondary | ICD-10-CM

## 2020-03-20 DIAGNOSIS — O99013 Anemia complicating pregnancy, third trimester: Secondary | ICD-10-CM | POA: Diagnosis present

## 2020-03-20 DIAGNOSIS — Z975 Presence of (intrauterine) contraceptive device: Secondary | ICD-10-CM

## 2020-03-20 DIAGNOSIS — U071 COVID-19: Secondary | ICD-10-CM | POA: Diagnosis present

## 2020-03-20 MED ORDER — FENTANYL CITRATE (PF) 100 MCG/2ML IJ SOLN
50.0000 ug | Freq: Once | INTRAMUSCULAR | Status: AC
Start: 2020-03-20 — End: 2020-03-20
  Administered 2020-03-20: 50 ug via INTRAMUSCULAR
  Filled 2020-03-20: qty 2

## 2020-03-20 NOTE — MAU Note (Signed)
..  Terri Herrera is a 30 y.o. at [redacted]w[redacted]d here in MAU via EMS reporting: contractions every 5 minutes. +FM. Reports mucous discharge denies leaking of fluid. Denies vaginal bleeding.  Last SVE: 03/16/2020 was 3cm Pain score: 10/10 with contractions Vitals:   03/20/20 2102  BP: 112/62  Pulse: 90  Resp: 16  Temp: 97.7 F (36.5 C)

## 2020-03-21 ENCOUNTER — Inpatient Hospital Stay (HOSPITAL_COMMUNITY): Payer: Medicaid Other | Admitting: Anesthesiology

## 2020-03-21 ENCOUNTER — Encounter (HOSPITAL_COMMUNITY): Payer: Self-pay | Admitting: Obstetrics and Gynecology

## 2020-03-21 ENCOUNTER — Other Ambulatory Visit: Payer: Self-pay

## 2020-03-21 DIAGNOSIS — O99334 Smoking (tobacco) complicating childbirth: Secondary | ICD-10-CM | POA: Diagnosis present

## 2020-03-21 DIAGNOSIS — Z30430 Encounter for insertion of intrauterine contraceptive device: Secondary | ICD-10-CM | POA: Diagnosis not present

## 2020-03-21 DIAGNOSIS — Z886 Allergy status to analgesic agent status: Secondary | ICD-10-CM | POA: Diagnosis not present

## 2020-03-21 DIAGNOSIS — O9852 Other viral diseases complicating childbirth: Secondary | ICD-10-CM | POA: Diagnosis present

## 2020-03-21 DIAGNOSIS — U071 COVID-19: Secondary | ICD-10-CM | POA: Diagnosis present

## 2020-03-21 DIAGNOSIS — O9903 Anemia complicating the puerperium: Secondary | ICD-10-CM | POA: Diagnosis not present

## 2020-03-21 DIAGNOSIS — O9853 Other viral diseases complicating the puerperium: Secondary | ICD-10-CM | POA: Diagnosis not present

## 2020-03-21 DIAGNOSIS — F17210 Nicotine dependence, cigarettes, uncomplicated: Secondary | ICD-10-CM | POA: Diagnosis present

## 2020-03-21 DIAGNOSIS — O99335 Smoking (tobacco) complicating the puerperium: Secondary | ICD-10-CM | POA: Diagnosis not present

## 2020-03-21 DIAGNOSIS — O9902 Anemia complicating childbirth: Secondary | ICD-10-CM | POA: Diagnosis present

## 2020-03-21 DIAGNOSIS — Z3A36 36 weeks gestation of pregnancy: Secondary | ICD-10-CM

## 2020-03-21 DIAGNOSIS — Z3689 Encounter for other specified antenatal screening: Secondary | ICD-10-CM

## 2020-03-21 DIAGNOSIS — D649 Anemia, unspecified: Secondary | ICD-10-CM | POA: Diagnosis present

## 2020-03-21 DIAGNOSIS — O4700 False labor before 37 completed weeks of gestation, unspecified trimester: Secondary | ICD-10-CM | POA: Diagnosis present

## 2020-03-21 DIAGNOSIS — Z975 Presence of (intrauterine) contraceptive device: Secondary | ICD-10-CM

## 2020-03-21 LAB — RPR: RPR Ser Ql: NONREACTIVE

## 2020-03-21 LAB — RAPID URINE DRUG SCREEN, HOSP PERFORMED
Amphetamines: NOT DETECTED
Barbiturates: NOT DETECTED
Benzodiazepines: NOT DETECTED
Cocaine: NOT DETECTED
Opiates: NOT DETECTED
Tetrahydrocannabinol: NOT DETECTED

## 2020-03-21 LAB — CBC
HCT: 32 % — ABNORMAL LOW (ref 36.0–46.0)
Hemoglobin: 11.3 g/dL — ABNORMAL LOW (ref 12.0–15.0)
MCH: 34.5 pg — ABNORMAL HIGH (ref 26.0–34.0)
MCHC: 35.3 g/dL (ref 30.0–36.0)
MCV: 97.6 fL (ref 80.0–100.0)
Platelets: 252 10*3/uL (ref 150–400)
RBC: 3.28 MIL/uL — ABNORMAL LOW (ref 3.87–5.11)
RDW: 12.7 % (ref 11.5–15.5)
WBC: 7.2 10*3/uL (ref 4.0–10.5)
nRBC: 0 % (ref 0.0–0.2)

## 2020-03-21 LAB — TYPE AND SCREEN
ABO/RH(D): O POS
Antibody Screen: NEGATIVE

## 2020-03-21 LAB — RESP PANEL BY RT-PCR (FLU A&B, COVID) ARPGX2
Influenza A by PCR: NEGATIVE
Influenza B by PCR: NEGATIVE
SARS Coronavirus 2 by RT PCR: POSITIVE — AB

## 2020-03-21 MED ORDER — ONDANSETRON HCL 4 MG/2ML IJ SOLN: 4.0000 mg | INTRAMUSCULAR | Status: DC | PRN

## 2020-03-21 MED ORDER — ONDANSETRON HCL 4 MG PO TABS
4.0000 mg | ORAL_TABLET | ORAL | Status: DC | PRN
  Administered 2020-03-21: 4 mg via ORAL
  Filled 2020-03-21: qty 1

## 2020-03-21 MED ORDER — BENZOCAINE-MENTHOL 20-0.5 % EX AERO: 1.0000 "application " | INHALATION_SPRAY | CUTANEOUS | Status: DC | PRN

## 2020-03-21 MED ORDER — IBUPROFEN 600 MG PO TABS
600.0000 mg | ORAL_TABLET | Freq: Four times a day (QID) | ORAL | Status: DC
  Administered 2020-03-21 – 2020-03-23 (×10): 600 mg via ORAL
  Filled 2020-03-21 (×10): qty 1

## 2020-03-21 MED ORDER — FENTANYL CITRATE (PF) 100 MCG/2ML IJ SOLN
50.0000 ug | INTRAMUSCULAR | Status: DC | PRN
  Administered 2020-03-21: 50 ug via INTRAMUSCULAR
  Filled 2020-03-21: qty 2

## 2020-03-21 MED ORDER — EPHEDRINE 5 MG/ML INJ
10.0000 mg | INTRAVENOUS | Status: DC | PRN
  Filled 2020-03-21: qty 2

## 2020-03-21 MED ORDER — MEASLES, MUMPS & RUBELLA VAC IJ SOLR: 0.5000 mL | Freq: Once | INTRAMUSCULAR | Status: DC

## 2020-03-21 MED ORDER — SIMETHICONE 80 MG PO CHEW: 80.0000 mg | CHEWABLE_TABLET | ORAL | Status: DC | PRN

## 2020-03-21 MED ORDER — PENICILLIN G POT IN DEXTROSE 60000 UNIT/ML IV SOLN: 3.0000 10*6.[IU] | INTRAVENOUS | Status: DC

## 2020-03-21 MED ORDER — SODIUM CHLORIDE (PF) 0.9 % IJ SOLN
INTRAMUSCULAR | Status: DC | PRN
  Administered 2020-03-21: 12 mL/h via EPIDURAL

## 2020-03-21 MED ORDER — LEVONORGESTREL 19.5 MCG/DAY IU IUD
INTRAUTERINE_SYSTEM | INTRAUTERINE | Status: AC
  Administered 2020-03-21: 1 via INTRAUTERINE
  Filled 2020-03-21: qty 1

## 2020-03-21 MED ORDER — OXYTOCIN BOLUS FROM INFUSION
333.0000 mL | Freq: Once | INTRAVENOUS | Status: AC
  Administered 2020-03-21: 333 mL via INTRAVENOUS

## 2020-03-21 MED ORDER — SODIUM CHLORIDE 0.9 % IV SOLN: 5.0000 10*6.[IU] | Freq: Once | INTRAVENOUS | Status: DC

## 2020-03-21 MED ORDER — LACTATED RINGERS IV SOLN: 500.0000 mL | INTRAVENOUS | Status: DC | PRN

## 2020-03-21 MED ORDER — PRENATAL MULTIVITAMIN CH
1.0000 | ORAL_TABLET | Freq: Every day | ORAL | Status: DC
  Administered 2020-03-21 – 2020-03-23 (×3): 1 via ORAL
  Filled 2020-03-21 (×3): qty 1

## 2020-03-21 MED ORDER — WITCH HAZEL-GLYCERIN EX PADS: 1.0000 "application " | MEDICATED_PAD | CUTANEOUS | Status: DC | PRN

## 2020-03-21 MED ORDER — SENNOSIDES-DOCUSATE SODIUM 8.6-50 MG PO TABS
2.0000 | ORAL_TABLET | ORAL | Status: DC
  Administered 2020-03-21 – 2020-03-22 (×2): 2 via ORAL
  Filled 2020-03-21 (×3): qty 2

## 2020-03-21 MED ORDER — COCONUT OIL OIL: 1.0000 "application " | TOPICAL_OIL | Status: DC | PRN

## 2020-03-21 MED ORDER — DIPHENHYDRAMINE HCL 50 MG/ML IJ SOLN: 12.5000 mg | INTRAMUSCULAR | Status: DC | PRN

## 2020-03-21 MED ORDER — DIPHENHYDRAMINE HCL 25 MG PO CAPS: 25.0000 mg | ORAL_CAPSULE | Freq: Four times a day (QID) | ORAL | Status: DC | PRN

## 2020-03-21 MED ORDER — PHENYLEPHRINE 40 MCG/ML (10ML) SYRINGE FOR IV PUSH (FOR BLOOD PRESSURE SUPPORT)
80.0000 ug | PREFILLED_SYRINGE | INTRAVENOUS | Status: DC | PRN
  Filled 2020-03-21: qty 10

## 2020-03-21 MED ORDER — LIDOCAINE HCL (PF) 1 % IJ SOLN
30.0000 mL | INTRAMUSCULAR | Status: DC | PRN
  Filled 2020-03-21: qty 30

## 2020-03-21 MED ORDER — OXYTOCIN-SODIUM CHLORIDE 30-0.9 UT/500ML-% IV SOLN
2.5000 [IU]/h | INTRAVENOUS | Status: DC
  Administered 2020-03-21: 2.5 [IU]/h via INTRAVENOUS
  Filled 2020-03-21: qty 500

## 2020-03-21 MED ORDER — OXYCODONE HCL 5 MG PO TABS
5.0000 mg | ORAL_TABLET | Freq: Once | ORAL | Status: AC
  Administered 2020-03-21: 5 mg via ORAL
  Filled 2020-03-21: qty 1

## 2020-03-21 MED ORDER — TETANUS-DIPHTH-ACELL PERTUSSIS 5-2.5-18.5 LF-MCG/0.5 IM SUSY: 0.5000 mL | PREFILLED_SYRINGE | Freq: Once | INTRAMUSCULAR | Status: DC

## 2020-03-21 MED ORDER — DIBUCAINE (PERIANAL) 1 % EX OINT: 1.0000 "application " | TOPICAL_OINTMENT | CUTANEOUS | Status: DC | PRN

## 2020-03-21 MED ORDER — LIDOCAINE HCL (PF) 1 % IJ SOLN
INTRAMUSCULAR | Status: DC | PRN
  Administered 2020-03-21: 5 mL via EPIDURAL

## 2020-03-21 MED ORDER — ONDANSETRON HCL 4 MG/2ML IJ SOLN: 4.0000 mg | Freq: Four times a day (QID) | INTRAMUSCULAR | Status: DC | PRN

## 2020-03-21 MED ORDER — LACTATED RINGERS IV SOLN: INTRAVENOUS | Status: DC

## 2020-03-21 MED ORDER — SOD CITRATE-CITRIC ACID 500-334 MG/5ML PO SOLN: 30.0000 mL | ORAL | Status: DC | PRN

## 2020-03-21 MED ORDER — LACTATED RINGERS IV SOLN: 500.0000 mL | Freq: Once | INTRAVENOUS | Status: DC

## 2020-03-21 MED ORDER — FENTANYL-BUPIVACAINE-NACL 0.5-0.125-0.9 MG/250ML-% EP SOLN
12.0000 mL/h | EPIDURAL | Status: DC | PRN
  Filled 2020-03-21: qty 250

## 2020-03-21 MED ORDER — LEVONORGESTREL 19.5 MCG/DAY IU IUD: INTRAUTERINE_SYSTEM | Freq: Once | INTRAUTERINE | Status: AC

## 2020-03-21 NOTE — Discharge Summary (Addendum)
Postpartum Discharge Summary       Patient Name: Terri Herrera DOB: [birth year] MRN: [id number]  Date of admission: 03/20/2020 Delivery date:03/21/2020  Delivering provider: Arrie Senate  Date of discharge: 03/22/2020  Admitting diagnosis: Preterm uterine contractions [O47.00] Intrauterine pregnancy: [redacted]w[redacted]d    Secondary diagnosis:  Active Problems:   Preterm delivery   History of prior pregnancy with SGA newborn   Tobacco abuse   Supervision of other normal pregnancy, antepartum   Tobacco use in pregnancy, antepartum   Limited prenatal care   Anemia in pregnancy, third trimester   Preterm uterine contractions   Non-stress test reactive   IUD (intrauterine device) in place Asymptomatic Covid  Additional problems: none    Discharge diagnosis: Preterm Pregnancy Delivered                                              Post partum procedures: post placental liletta placed Augmentation: AROM Complications: None  Hospital course: Onset of Labor With Vaginal Delivery      30y.o. yo [id number] admitted in Latent Labor on 03/20/2020. Patient presented to the MAU in latent labor and was found to be 4cm dilation. NRNST in MAU so BPP obtained, 6/8. Decision made to admit patient to L&D for suspected latent labor and observation. Patient spontaneously progressed to 8cm and was having prolonged decelerations so decision made to AROM patient at that time. Patient then spontaneously delivered without difficulty. Patient had an uncomplicated labor course as follows:  Membrane Rupture Time/Date: 3:17 AM ,03/21/2020   Delivery Method:Vaginal, Spontaneous  Episiotomy: None  Lacerations:  None  Patient had an uncomplicated postpartum course.  She is ambulating, tolerating a regular diet, passing flatus, and urinating well. Patient is discharged home in stable condition on 03/22/20.  Newborn Data: Birth date:03/21/2020  Birth time:3:40 AM  Gender:Female  Living status:Living   Apgars:9 ,9  Weight:2495 g   Magnesium Sulfate received: No BMZ received: No Rhophylac:N/A MMR:N/A T-DaP:Given prenatally Flu: No Transfusion:No  Physical exam  Vitals:   03/21/20 0923 03/21/20 1335 03/21/20 1720 03/21/20 2036  BP: (!) 91/54 (!) 104/55 (!) 106/59 (!) 91/57  Pulse: 66 67 66 62  Resp: _0 Temp: 97.7 F (36.5 C) 98.4 F (36.9 C) 98.2 F (36.8 C) 97.7 F (36.5 C)  TempSrc: Oral Oral Oral Oral  SpO2:  100% 100%   Weight:      Height:       General: alert, cooperative and no distress Lochia: appropriate Uterine Fundus: firm Incision: Healing well with no significant drainage DVT Evaluation: No evidence of DVT seen on physical exam. Labs: Lab Results  Component Value Date   WBC 7.2 03/21/2020   HGB 11.3 (L) 03/21/2020   HCT 32.0 (L) 03/21/2020   MCV 97.6 03/21/2020   PLT 252 03/21/2020   CMP Latest Ref Rng & Units 01/06/2020  Glucose 70 - 99 mg/dL 92  BUN 6 - 20 mg/dL <5(L)  Creatinine 0.44 - 1.00 mg/dL 0.49  Sodium 135 - 145 mmol/L 136  Potassium 3.5 - 5.1 mmol/L 3.7  Chloride 98 - 111 mmol/L 106  CO2 22 - 32 mmol/L 20(L)  Calcium 8.9 - 10.3 mg/dL 9.0  Total Protein 6.5 - 8.1 g/dL 6.3(L)  Total Bilirubin 0.3 - 1.2 mg/dL 0.8  Alkaline Phos 38 - 126 U/L  26(L)  AST 15 - 41 U/L 27  ALT 0 - 44 U/L 32   Edinburgh Score: Edinburgh Postnatal Depression Scale Screening Tool 04/30/2018  I have been able to laugh and see the funny side of things. 0  I have looked forward with enjoyment to things. 0  I have blamed myself unnecessarily when things went wrong. 0  I have been anxious or worried for no good reason. 2  I have felt scared or panicky for no good reason. 0  Things have been getting on top of me. 1  I have been so unhappy that I have had difficulty sleeping. 0  I have felt sad or miserable. 0  I have been so unhappy that I have been crying. 1  The thought of harming myself has occurred to me. 0  Edinburgh Postnatal Depression Scale  Total 4     After visit meds:  Allergies as of 03/22/2020       Reactions   Acetaminophen Itching   No reaction; pt prefers not to take        Medication List     TAKE these medications    ferrous sulfate 325 (65 FE) MG tablet Take 1 tablet (325 mg total) by mouth every other day.   ibuprofen 600 MG tablet Commonly known as: ADVIL Take 1 tablet (600 mg total) by mouth every 6 (six) hours.   Prenatal Vitamin 27-0.8 MG Tabs Take 1 tablet by mouth at bedtime.   sennosides-docusate sodium 8.6-50 MG tablet Commonly known as: SENOKOT-S Take 1 tablet by mouth daily.         Discharge home in stable condition Infant Feeding: Bottle Infant Disposition:home with mother Discharge instruction: per After Visit Summary and Postpartum booklet. Activity: Advance as tolerated. Pelvic rest for 6 weeks.  Diet: routine diet Future Appointments: Future Appointments  Date Time Provider Department Center  04/11/2020  3:55 PM Pickens, Charlie, MD [facility]   Follow up Visit:  Message sent to [facility] 03/21/20 by [facility].   Please schedule this patient for a In person postpartum visit in 4 weeks with the following provider: Any provider. Additional Postpartum F/U: none   High risk pregnancy complicated by:  etoh use, tobacco use, limited PNC Delivery mode:  Vaginal, Spontaneous  Anticipated Birth Control:  PP IUD placed, string check at pp visit   03/22/2020 Julia M Marsala, MD   

## 2020-03-21 NOTE — H&P (Addendum)
Obstetrics Admission History & Physical  03/21/2020 - 12:01 AM Primary OBGYN: [facility]  Chief Complaint: preterm contractions  History of Present Illness  30 y.o. [id number] @ [redacted]w[redacted]d, with the above CC. Pregnancy complicated by: , scant PNC (one official OB visit), poor dates, h/o FGR, anemia  Ms. LOWELLA KINDLEY states that she's had worsening UCs for the past few days. No VB or LOF. Patient was 3cm on 1/5. 4cm in MAU and fetus category I but no accels. BPP just done and results pending  Patient feels UCs worsening at q20m.  Review of Systems:  History of Present Illness.  Patient Active Problem List   Diagnosis Date Noted  . Preterm uterine contractions 03/21/2020  . Tobacco use in pregnancy, antepartum 03/02/2020  . Limited prenatal care 03/02/2020  . Anemia in pregnancy, third trimester 03/02/2020  . Supervision of other normal pregnancy, antepartum 10/02/2019  . Alcohol abuse affecting pregnancy 03/28/2018  . Tobacco abuse 03/28/2018  . History of prior pregnancy with SGA newborn 11/15/2017    PMHx:  Past Medical History:  Diagnosis Date  . Alcohol use affecting pregnancy   . Bronchitis   . Gonorrhea   . Vaginal Pap smear, abnormal    PSHx:  Past Surgical History:  Procedure Laterality Date  . broken knee May 2021 right    . WISDOM TOOTH EXTRACTION     Medications:  Medications Prior to Admission  Medication Sig Dispense Refill Last Dose  . Prenatal Vit-Fe Fumarate-FA (PRENATAL VITAMIN) 27-0.8 MG TABS Take 1 tablet by mouth at bedtime. 90 tablet 4 03/20/2020 at Unknown time  . ferrous sulfate 325 (65 FE) MG tablet Take 1 tablet (325 mg total) by mouth every other day. (Patient not taking: Reported on 03/16/2020) 30 tablet 3   . sennosides-docusate sodium (SENOKOT-S) 8.6-50 MG tablet Take 1 tablet by mouth daily. (Patient not taking: Reported on 03/16/2020) 30 tablet 1      Allergies: is allergic to acetaminophen. OBHx:  OB History  Gravida Para Term Preterm AB Living  6 2 2    3 2   SAB IAB Ectopic Multiple Live Births  3     0 2    # Outcome Date GA Lbr Len/2nd Weight Sex Delivery Anes PTL Lv  6 Current           5 Term 04/30/18 [redacted]w[redacted]d 04:15 / 00:04 2319 g F Vag-Spont EPI  LIV  4 Term 05/24/14 [redacted]w[redacted]d 14:28 / 00:35 2240 g F Vag-Spont EPI  LIV  3 SAB 2014          2 SAB 2008          1 SAB 2008             Birth Comments: System Generated. Please review and update pregnancy details.    Obstetric Comments  Admitted ETOH daily and tobacco use during pregnancy           FHx:  Family History  Problem Relation Age of Onset  . Cancer Paternal Grandmother   . Asthma Mother   . Diabetes Mother   . Alcohol abuse Mother   . Cancer Father   . Cancer Paternal Uncle    Soc Hx:  Social History   Socioeconomic History  . Marital status: Single    Spouse name: Not on file  . Number of children: Not on file  . Years of education: Not on file  . Highest education level: Not on file  Occupational History  . Not on  file  Tobacco Use  . Smoking status: Light Tobacco Smoker    Packs/day: 0.25    Types: Cigarettes  . Smokeless tobacco: Never Used  . Tobacco comment: NON- MENTHOL  Vaping Use  . Vaping Use: Never used  Substance and Sexual Activity  . Alcohol use: Yes    Alcohol/week: 72.0 standard drinks    Types: 72 Cans of beer per week    Comment: occasional  . Drug use: Not Currently  . Sexual activity: Yes    Birth control/protection: None    Comment: pregnant  Other Topics Concern  . Not on file  Social History Narrative  . Not on file   Social Determinants of Health   Financial Resource Strain: Not on file  Food Insecurity: No Food Insecurity  . Worried About Programme researcher, broadcasting/film/video in the Last Year: Never true  . Ran Out of Food in the Last Year: Never true  Transportation Needs: No Transportation Needs  . Lack of Transportation (Medical): No  . Lack of Transportation (Non-Medical): No  Physical Activity: Not on file  Stress: Not on file   Social Connections: Not on file  Intimate Partner Violence: Not on file    Objective    Current Vital Signs 24h Vital Sign Ranges  T 97.7 F (36.5 C) Temp  Avg: 97.7 F (36.5 C)  Min: 97.7 F (36.5 C)  Max: 97.7 F (36.5 C)  BP 112/62 BP  Min: 112/62  Max: 112/62  HR 90 Pulse  Avg: 90  Min: 90  Max: 90  RR 16 Resp  Avg: 16  Min: 16  Max: 16  SaO2     No data recorded       24 Hour I/O Current Shift I/O  Time Ins Outs No intake/output data recorded. No intake/output data recorded.   EFM: 125 baseline, no accels, +early decel, mod variability  Toco: q35m  General: mild to moderate distress with UCs Skin:  Warm and dry.  Cardiovascular: S1, S2 normal, no murmur, rub or gallop, regular rate and rhythm Respiratory:  Clear to auscultation bilateral. Normal respiratory effort Abdomen: gravid nttp Neuro/Psych:  Normal mood and affect.   SVE: 5/70/bow intact/0 by my exam  Labs  pending  Radiology bpp pending  12/22: efw 65%, 2500gm, ac 90%, bpp 8/8, normal AFI   Assessment & Plan   29 y.o. [id number] @ [redacted]w[redacted]d with preterm contractions concerning for PTL. Pt stable Admit to L&D. I d/w her that will do exp management. IV PRN for pain. GBS neg. If changes her cervix more, then can augment  UDS, covid swab and basic admit labs ordered  Follow up bpp  Cornelia Copa MD Attending Center for [facility])

## 2020-03-21 NOTE — Discharge Instructions (Signed)
Intrauterine Device Information An intrauterine device (IUD) is a medical device that is inserted into the uterus to prevent pregnancy. It is a small, T-shaped device that has one or two nylon strings hanging down from it. The strings hang out of the lower part of the uterus (cervix) to allow for future IUD removal. There are two types of IUDs:  Hormone IUD. This type of IUD is made of plastic and contains the hormone progestin (synthetic progesterone). A hormone IUD may last 3-5 years.  Copper IUD. This type of IUD has copper wire wrapped around it. A copper IUD may last up to 10 years. How is an IUD inserted? An IUD is inserted through the vagina, through the cervix, and into the uterus with a minor medical procedure. The procedure for IUD insertion may vary among health care providers and hospitals. How does an IUD work? Synthetic progesterone in a hormonal IUD prevents pregnancy by:  Thickening cervical mucus to prevent sperm from entering the uterus.  Thinning the uterine lining to prevent a fertilized egg from being implanted there. Copper in a copper IUD prevents pregnancy by making the uterus and fallopian tubes produce a fluid that kills sperm. What are the advantages of an IUD? Advantages of either type of IUD An IUD:  Is highly effective in preventing pregnancy.  Is reversible. You can become pregnant shortly after the IUD is removed.  Is low-maintenance and can stay in place for a long time.  Has no estrogen-related side effects.  Can be used when breastfeeding.  Is not associated with weight gain.  Can be inserted right after childbirth, an abortion, or a miscarriage. Advantages of a hormone IUD  If it is inserted within 7 days of your period starting, it works right after it has been inserted. If the hormone IUD is inserted at any other time in your cycle, you will need to use a backup method of birth control for 7 days after insertion.  It can make menstrual periods  lighter or stop completely.  It can reduce menstrual cramping and other discomforts from menstrual periods.  It can be used for 3-5 years, depending on which IUD you have. Advantages of a copper IUD  It works right after it is inserted.  It can be used as a form of emergency birth control if it is inserted within 5 days after having unprotected sex.  It does not interfere with your body's natural hormones.  It can be used for up to 10 years. What are the disadvantages of an IUD?  An IUD may cause irregular menstrual bleeding for a period of time after insertion.  It is common to have pain during insertion and have cramping and vaginal bleeding after insertion.  An IUD may cut the uterus (uterine perforation) when it is inserted. This is rare.  Pelvic inflammatory disease (PID) may happen after insertion of an IUD. PID is an infection in the uterus and fallopian tubes. The IUD does not cause the infection. The infection is usually from an unknown sexually transmitted infection (STI). This is rare, and it usually happens during the first 20 days after the IUD is inserted.  A copper IUD can make your menstrual flow heavier and more painful.  IUDs cannot prevent sexually transmitted infections (STIs). How is an IUD removed?   You will lie on your back with your knees bent and your feet in footrests (stirrups).  A device will be inserted into your vagina to spread apart the vaginal walls (  speculum). This will allow your health care provider to see the strings attached to the IUD.  Your health care provider will use a small instrument (forceps) to grasp the IUD strings and will pull firmly until the IUD is removed. You may have some discomfort when the IUD is removed. Your health care provider may recommend taking over-the-counter pain relievers, such as ibuprofen, before the procedure. You may also have minor spotting for a few days after the procedure. The procedure for IUD removal may  vary among health care providers and hospitals. Is an IUD right for me? If you are interested in an IUD, discuss it with your health care provider. He or she will make sure you are a good candidate for an IUD and will let you know more about the advantages, disadvantage, and possible side effects. This will allow you to make a decision about the device. Summary  An intrauterine device (IUD) is a medical device that is inserted in the uterus to prevent pregnancy. It is a small, T-shaped device that has one or two nylon strings hanging down from it.  A hormone IUD contains the hormone progestin (synthetic progesterone). A copper IUD has copper wire wrapped around it.  Synthetic progesterone in a hormone IUD prevents pregnancy by thickening cervical mucus and thinning the walls of the uterus. Copper in a copper IUD prevents pregnancy by making the uterus and fallopian tubes produce a fluid that kills sperm.  A hormone IUD can be left in place for 3-5 years. A copper IUD can be left in place for up to 10 years.  An IUD is inserted and removed by a health care provider. You may feel some pain during insertion and removal. Your health care provider may recommend taking over-the-counter pain medicine, such as ibuprofen, before an IUD procedure. This information is not intended to replace advice given to you by your health care provider. Make sure you discuss any questions you have with your health care provider. Document Revised: 09/09/2019 Document Reviewed: 09/09/2019 Elsevier Patient Education  2021 Elsevier Inc.   Postpartum Care After Vaginal Delivery The following information offers guidance about how to care for yourself from the time you deliver your baby to 6-12 weeks after delivery (postpartum period). If you have problems or questions, contact your health care provider for more specific instructions. Follow these instructions at home: Vaginal bleeding  It is normal to have vaginal  bleeding (lochia) after delivery. Wear a sanitary pad for bleeding and discharge. ? During the first week after delivery, the amount and appearance of lochia is often similar to a menstrual period. ? Over the next few weeks, it will gradually decrease to a dry, yellow-brown discharge. ? For most women, lochia stops completely by 4-6 weeks after delivery, but can vary.  Change your sanitary pads frequently. Watch for any changes in your flow, such as: ? A sudden increase in volume. ? A change in color. ? Large blood clots.  If you pass a blood clot from your vagina, save it and call your health care provider. Do not flush blood clots down the toilet before talking with your health care provider.  Do not use tampons or douches until your health care provider approves.  If you are not breastfeeding, your period should return 6-8 weeks after delivery. If you are feeding your baby breast milk only, your period may not return until you stop breastfeeding. Perineal care  Keep the area between the vagina and the anus (perineum) clean and  dry. Use medicated pads and pain-relieving sprays and creams as directed.  If you had a surgical cut in the perineum (episiotomy) or a tear, check the area for signs of infection until you are healed. Check for: ? More redness, swelling, or pain. ? Fluid or blood coming from the cut or tear. ? Warmth. ? Pus or a bad smell.  You may be given a squirt bottle to use instead of wiping to clean the perineum area after you use the bathroom. Pat the area gently to dry it.  To relieve pain caused by an episiotomy, a tear, or swollen veins in the anus (hemorrhoids), take a warm sitz bath 2-3 times a day. In a sitz bath, the warm water should only come up to your hips and cover your buttocks.   Breast care  In the first few days after delivery, your breasts may feel heavy, full, and uncomfortable (breast engorgement). Milk may also leak from your breasts. Ask your health  care provider about ways to help relieve the discomfort.  If you are breastfeeding: ? Wear a bra that supports your breasts and fits well. Use breast pads to absorb milk that leaks. ? Keep your nipples clean and dry. Apply creams and ointments as told. ? You may have uterine contractions every time you breastfeed for up to several weeks after delivery. This helps your uterus return to its normal size. ? If you have any problems with breastfeeding, notify your health care provider or lactation consultant.  If you are not breastfeeding: ? Avoid touching your breasts. Do not squeeze out (express) milk. Doing this can make your breasts produce more milk. ? Wear a good-fitting bra and use cold packs to help with swelling. Intimacy and sexuality  Ask your health care provider when you can engage in sexual activity. This may depend upon: ? Your risk of infection. ? How fast you are healing. ? Your comfort and desire to engage in sexual activity.  You are able to get pregnant after delivery, even if you have not had your period. Talk with your health care provider about methods of birth control (contraception) or family planning if you desire future pregnancies. Medicines  Take over-the-counter and prescription medicines only as told by your health care provider.  Take an over-the-counter stool softener to help ease bowel movements as told by your health care provider.  If you were prescribed an antibiotic medicine, take it as told by your health care provider. Do not stop taking the antibiotic even if you start to feel better.  Review all previous and current prescriptions to check for possible transfer into breast milk. Activity  Gradually return to your normal activities as told by your health care provider.  Rest as much as possible. Nap while your baby is sleeping. Eating and drinking  Drink enough fluid to keep your urine pale yellow.  To help prevent or relieve constipation, eat  high-fiber foods every day.  Choose healthy eating to support breastfeeding or weight loss goals.  Take your prenatal vitamins until your health care provider tells you to stop.   General tips/recommendations  Do not use any products that contain nicotine or tobacco. These products include cigarettes, chewing tobacco, and vaping devices, such as e-cigarettes. If you need help quitting, ask your health care provider.  Do not drink alcohol, especially if you are breastfeeding.  Do not take medications or drugs that are not prescribed to you, especially if you are breastfeeding.  Visit your health care  provider for a postpartum checkup within the first 3-6 weeks after delivery.  Complete a comprehensive postpartum visit no later than 12 weeks after delivery.  Keep all follow-up visits for you and your baby. Contact a health care provider if:  You feel unusually sad or worried.  Your breasts become red, painful, or hard.  You have a fever or other signs of an infection.  You have bleeding that is soaking through one pad an hour or you have blood clots.  You have a severe headache that doesn't go away or you have vision changes.  You have nausea and vomiting and are unable to eat or drink anything for 24 hours. Get help right away if:  You have chest pain or difficulty breathing.  You have sudden, severe leg pain.  You faint or have a seizure.  You have thoughts about hurting yourself or your baby. If you ever feel like you may hurt yourself or others, or have thoughts about taking your own life, get help right away. Go to your nearest emergency department or:  Call your local emergency services (911 in the [place]  The National Suicide Prevention Lifeline at 609-301-3038. This suicide crisis helpline is open 24 hours a day.  Text the Crisis Text Line at 716-472-8894 (in the [place] Summary  The period of time after you deliver your newborn up to 6-12 weeks after delivery is  called the postpartum period.  Keep all follow-up visits for you and your baby.  Review all previous and current prescriptions to check for possible transfer into breast milk.  Contact a health care provider if you feel unusually sad or worried during the postpartum period. This information is not intended to replace advice given to you by your health care provider. Make sure you discuss any questions you have with your health care provider. Document Revised: 11/12/2019 Document Reviewed: 11/12/2019 Elsevier Patient Education  2021 [employer].

## 2020-03-21 NOTE — Procedures (Signed)
  Post-Placental IUD Insertion Procedure Note  Patient identified, informed consent signed prior to delivery, signed copy in chart, time out was performed.    Vaginal, labial and perineal areas thoroughly inspected for lacerations. No laceration identified.  Liletta  - IUD grasped between sterile gloved fingers. Sterile lubrication applied to sterile gloved hand for ease of insertion. Fundus identified through abdominal wall using non-insertion hand. IUD inserted to fundus with bimanual technique. IUD carefully released at the fundus and insertion hand gently removed from vagina.   Patient given post procedure instructions and IUD care card with expiration date.  Patient is asked to keep IUD strings tucked in her vagina until her postpartum follow up visit in 4-6 weeks. Patient advised to abstain from sexual intercourse and pulling on strings before her follow-up visit. Patient verbalized an understanding of the plan of care and agrees.   Alric Seton, MD OB Fellow, Faculty [facility] 03/21/2020 4:15 AM

## 2020-03-21 NOTE — Anesthesia Preprocedure Evaluation (Signed)
Anesthesia Evaluation  Patient identified by MRN, date of birth, ID band Patient awake    Reviewed: Allergy & Precautions, NPO status , Patient's Chart, lab work & pertinent test results  Airway Mallampati: II  TM Distance: >3 FB Neck ROM: Full    Dental no notable dental hx. (+) Dental Advisory Given, Teeth Intact   Pulmonary Current Smoker and Patient abstained from smoking.,  Covid Pend   Pulmonary exam normal breath sounds clear to auscultation       Cardiovascular Exercise Tolerance: Good negative cardio ROS Normal cardiovascular exam Rhythm:Regular Rate:Normal     Neuro/Psych PSYCHIATRIC DISORDERS negative neurological ROS     GI/Hepatic negative GI ROS, Neg liver ROS,   Endo/Other  negative endocrine ROS  Renal/GU negative Renal ROS     Musculoskeletal negative musculoskeletal ROS (+)   Abdominal   Peds  Hematology  (+) anemia , Lab Results      Component                Value               Date                      WBC                      7.2                 03/21/2020                HGB                      11.3 (L)            03/21/2020                HCT                      32.0 (L)            03/21/2020                MCV                      97.6                03/21/2020                PLT                      252                 03/21/2020              Anesthesia Other Findings   Reproductive/Obstetrics (+) Pregnancy                             Anesthesia Physical Anesthesia Plan  ASA: II  Anesthesia Plan: Epidural   Post-op Pain Management:    Induction:   PONV Risk Score and Plan:   Airway Management Planned: Nasal Cannula  Additional Equipment: None  Intra-op Plan:   Post-operative Plan:   Informed Consent: I have reviewed the patients History and Physical, chart, labs and discussed the procedure including the risks, benefits and alternatives for the  proposed anesthesia with the patient or authorized representative who has indicated his/her understanding and acceptance.  Plan Discussed with: CRNA  Anesthesia Plan Comments: (36.6wk G6P2 for LEA)        Anesthesia Quick Evaluation

## 2020-03-21 NOTE — Clinical Social Work Maternal (Signed)
CLINICAL SOCIAL WORK MATERNAL/CHILD NOTE  Patient Details  Name: Terri Herrera MRN: [id number] Date of Birth: 20-Apr-1990  Date:  03/21/2020  Clinical Social Worker Initiating Note:  Manfred Arch, MSW, [place] Date/Time: Initiated:  03/21/20/0100     Child's Name:  Undecided   Biological Parents:  Mother,Father Leroy Kennedy 10/15/1988)   Need for Interpreter:  None   Reason for Referral:  Late or No Prenatal Care ,Other (Comment) (ETOH during pregnancy)   Address:  [address] [place] [address]    Phone number:  [phone] (home)     Additional phone number:   Household Members/Support Persons (HM/SP):   Household Member/Support Person 1,Household Member/Support Person 2   HM/SP Name Relationship DOB or Age  HM/SP -1 Shirnat Williamson-Dumlao Daughter 04/30/18  HM/SP -2 Mikey [employer] Williamson-Fendley Daughter 05/24/14  HM/SP -3        HM/SP -4        HM/SP -5        HM/SP -6        HM/SP -7        HM/SP -8          Natural Supports (not living in the home):  Immediate Family   Professional Supports: None   Employment: Unemployed   Type of Work:     Education:  Engineer, agricultural   Homebound arranged:    Surveyor, quantity Resources:  Medicaid   Other Resources:  Sales executive ,WIC   Cultural/Religious Considerations Which May Impact Care:    Strengths:  Ability to meet basic needs ,Pediatrician chosen,Home prepared for child    Psychotropic Medications:         Pediatrician:    Armed forces operational officer area  Pediatrician List:   [facility]  [place]    [place]    [facility] [place]      Pediatrician Fax Number:    Risk Factors/Current Problems:  None   Cognitive State:  Alert    Mood/Affect:  Calm    CSW Assessment: CSW consulted for limited prenatal care and etoh during pregnancy. CSW contacted MOB by telephone due to Covid positive status, to complete assessment and  offer support. CSW congratulated MOB and informed of reason for consult. MOB was receptive and pleasant with CSW. MBO reported she did not have prenatal care due to insurance issues. MOB stated she did not have pregnancy Medicaid and once it was received she went to an appointment. MOB denies any additional barriers to care. CSW discussed alcohol use with MOB. MOB reported she only drank wine during the pregnancy. MOB stated she drank wine on 07-09-22, following the death of her cousin. CSW informed MOB of the hospital drug screen policy. MOB was informed a CPS report will be made if baby test positive for substances. MOB denied any substance use during pregnancy. MOB disclosed CPS history for drinking beer during pregnancy. MOB stated the case was closed and denies any additional CPS history.   MOB resides with her two children. MOB receives food stamps and has a [facility] appointment on Thursday. MOB denies any mental health history. MOB reported she is currently doing well. MOB denies any SI, HI or DV. MOB identified FOB and her mother as supports. CSW reviewed baby blues versus perinatal mood disorders. CSW provided education on PPD and provided MOB with New Mom checklist and encouraged MOB to contact a professional if symptoms arise.  CSW provided  review of Sudden Infant Death Syndrome (SIDS) precautions.  MOB reported baby will sleep in a bassinet. MOB stated she has everything needed for baby except a car seat. MOB was informed a car seat can be provided at a $30 cost. MOB reported she has never received a car seat from the hospital and stated she can cover the cost. MOB declined having any additional needs or concerns at this time.  CSW will continue to follow UDS/CDS and make a CPS report if warranted. CSW identifies no further need for intervention and no barriers to discharge at this time.   CSW Plan/Description:  CSW Will Continue to Monitor Umbilical Cord Tissue Drug Screen Results and Make Report if  Warranted,Child Protective Service Report ,Hospital Drug Screen Policy Information,Perinatal Mood and Anxiety Disorder (PMADs) Education,Sudden Infant Death Syndrome (SIDS) Education,No Further Intervention Required/No Barriers to Discharge    Cleda Clarks, LCSWA 03/21/2020, 1:39 PM

## 2020-03-21 NOTE — Anesthesia Postprocedure Evaluation (Signed)
Anesthesia Post Note  Patient: Terri Herrera  Procedure(s) Performed: AN AD HOC LABOR EPIDURAL     Patient location during evaluation: Mother Baby Anesthesia Type: Epidural Level of consciousness: awake Pain management: satisfactory to patient Vital Signs Assessment: post-procedure vital signs reviewed and stable Respiratory status: spontaneous breathing Cardiovascular status: stable Anesthetic complications: no   No complications documented.  Last Vitals:  Vitals:   03/21/20 0923 03/21/20 1335  BP: (!) 91/54 (!) 104/55  Pulse: 66 67  Resp: 18 18  Temp: 36.5 C 36.9 C  SpO2:  100%    Last Pain:  Vitals:   03/21/20 1335  TempSrc: Oral  PainSc: 4    Pain Goal: Patients Stated Pain Goal: 0 (03/20/20 2103)                 Cephus Shelling

## 2020-03-21 NOTE — Anesthesia Procedure Notes (Signed)
Epidural Patient location during procedure: OB Start time: 03/21/2020 2:42 AM End time: 03/21/2020 2:56 AM  Staffing Anesthesiologist: Trevor Iha, MD Performed: anesthesiologist   Preanesthetic Checklist Completed: patient identified, IV checked, site marked, risks and benefits discussed, surgical consent, monitors and equipment checked, pre-op evaluation and timeout performed  Epidural Patient position: sitting Prep: DuraPrep and site prepped and draped Patient monitoring: continuous pulse ox and blood pressure Approach: midline Location: L3-L4 Injection technique: LOR air  Needle:  Needle type: Tuohy  Needle gauge: 17 G Needle length: 9 cm and 9 Needle insertion depth: 6 cm Catheter type: closed end flexible Catheter size: 19 Gauge Catheter at skin depth: 11 cm Test dose: negative  Assessment Events: blood not aspirated, injection not painful, no injection resistance, no paresthesia and negative IV test  Additional Notes Patient identified. Risks/Benefits/Options discussed with patient including but not limited to bleeding, infection, nerve damage, paralysis, failed block, incomplete pain control, headache, blood pressure changes, nausea, vomiting, reactions to medication both or allergic, itching and postpartum back pain. Confirmed with bedside nurse the patient's most recent platelet count. Confirmed with patient that they are not currently taking any anticoagulation, have any bleeding history or any family history of bleeding disorders. Patient expressed understanding and wished to proceed. All questions were answered. Sterile technique was used throughout the entire procedure. Please see nursing notes for vital signs. Test dose was given through epidural needle and negative prior to continuing to dose epidural or start infusion. Warning signs of high block given to the patient including shortness of breath, tingling/numbness in hands, complete motor block, or any  concerning symptoms with instructions to call for help. Patient was given instructions on fall risk and not to get out of bed. All questions and concerns addressed with instructions to call with any issues.  1 Attempt (S) . Patient tolerated procedure well.

## 2020-03-21 NOTE — H&P (Incomplete)
Obstetrics Admission History & Physical  03/21/2020 - 12:01 AM Primary OBGYN: [facility]  Chief Complaint: preterm contractions  History of Present Illness  30 y.o. [id number] @ [redacted]w[redacted]d, with the above CC. Pregnancy complicated by: , scant PNC (one official OB visit), poor dates, h/o FGR, anemia  Ms. Terri Herrera states that she's had worsening UCs for the past few days. No VB or LOF. Patient was 3cm on 1/5. 4cm in MAU and fetus category I but no accels. BPP just done and results pending  Patient feels UCs worsening at q26m.  Review of Systems:  History of Present Illness.  Patient Active Problem List   Diagnosis Date Noted  . Preterm uterine contractions 03/21/2020  . Decreased fetal movement affecting management of pregnancy in third trimester 03/02/2020  . Tobacco use in pregnancy, antepartum 03/02/2020  . Limited prenatal care 03/02/2020  . Anemia in pregnancy, third trimester 03/02/2020  . Supervision of other normal pregnancy, antepartum 10/02/2019  . Alcohol abuse affecting pregnancy 03/28/2018  . Tobacco abuse 03/28/2018  . History of preterm delivery, currently pregnant 11/15/2017  . History of prior pregnancy with SGA newborn 11/15/2017    PMHx:  Past Medical History:  Diagnosis Date  . Alcohol use affecting pregnancy   . Bronchitis   . Gonorrhea   . Vaginal Pap smear, abnormal    PSHx:  Past Surgical History:  Procedure Laterality Date  . broken knee May 2021 right    . WISDOM TOOTH EXTRACTION     Medications:  Medications Prior to Admission  Medication Sig Dispense Refill Last Dose  . Prenatal Vit-Fe Fumarate-FA (PRENATAL VITAMIN) 27-0.8 MG TABS Take 1 tablet by mouth at bedtime. 90 tablet 4 03/20/2020 at Unknown time  . ferrous sulfate 325 (65 FE) MG tablet Take 1 tablet (325 mg total) by mouth every other day. (Patient not taking: Reported on 03/16/2020) 30 tablet 3   . sennosides-docusate sodium (SENOKOT-S) 8.6-50 MG tablet Take 1 tablet by mouth daily. (Patient not  taking: Reported on 03/16/2020) 30 tablet 1      Allergies: is allergic to acetaminophen. OBHx:  OB History  Gravida Para Term Preterm AB Living  6 2 2   3 2   SAB IAB Ectopic Multiple Live Births  3     0 2    # Outcome Date GA Lbr Len/2nd Weight Sex Delivery Anes PTL Lv  6 Current           5 Term 04/30/18 [redacted]w[redacted]d 04:15 / 00:04 2319 g F Vag-Spont EPI  LIV  4 Term 05/24/14 [redacted]w[redacted]d 14:28 / 00:35 2240 g F Vag-Spont EPI  LIV  3 SAB 2014          2 SAB 2008          1 SAB 2008             Birth Comments: System Generated. Please review and update pregnancy details.    Obstetric Comments  Admitted ETOH daily and tobacco use during pregnancy   *** GYNHx:  History of STIs: {YES 2009.             FHx:  Family History  Problem Relation Age of Onset  . Cancer Paternal Grandmother   . Asthma Mother   . Diabetes Mother   . Alcohol abuse Mother   . Cancer Father   . Cancer Paternal Uncle    Soc Hx:  Social History   Socioeconomic History  . Marital status: Single    Spouse name:  Not on file  . Number of children: Not on file  . Years of education: Not on file  . Highest education level: Not on file  Occupational History  . Not on file  Tobacco Use  . Smoking status: Light Tobacco Smoker    Packs/day: 0.25    Types: Cigarettes  . Smokeless tobacco: Never Used  . Tobacco comment: NON- MENTHOL  Vaping Use  . Vaping Use: Never used  Substance and Sexual Activity  . Alcohol use: Yes    Alcohol/week: 72.0 standard drinks    Types: 72 Cans of beer per week    Comment: occasional  . Drug use: Not Currently  . Sexual activity: Yes    Birth control/protection: None    Comment: pregnant  Other Topics Concern  . Not on file  Social History Narrative  . Not on file   Social Determinants of Health   Financial Resource Strain: Not on file  Food Insecurity: No Food Insecurity  . Worried About Programme researcher, broadcasting/film/video in the Last Year: Never true  . Ran Out of Food in the Last  Year: Never true  Transportation Needs: No Transportation Needs  . Lack of Transportation (Medical): No  . Lack of Transportation (Non-Medical): No  Physical Activity: Not on file  Stress: Not on file  Social Connections: Not on file  Intimate Partner Violence: Not on file    Objective   Vitals:   03/20/20 2102  BP: 112/62  Pulse: 90  Resp: 16  Temp: 97.7 F (36.5 C)   Temp:  [97.7 F (36.5 C)] 97.7 F (36.5 C) (01/09 2102) Pulse Rate:  [90] 90 (01/09 2102) Resp:  [16] 16 (01/09 2102) BP: (112)/(62) 112/62 (01/09 2102) Weight:  [64 kg] 64 kg (01/09 2102) Temp (24hrs), Avg:97.7 F (36.5 C), Min:97.7 F (36.5 C), Max:97.7 F (36.5 C)  No intake or output data in the 24 hours ending 03/21/20 0001  ***please choose whichever way seems best to display the VS and then delete either the one above or below this messaged, based on which one works best***  Current Vital Signs 24h Vital Sign Ranges  T 97.7 F (36.5 C) Temp  Avg: 97.7 F (36.5 C)  Min: 97.7 F (36.5 C)  Max: 97.7 F (36.5 C)  BP 112/62 BP  Min: 112/62  Max: 112/62  HR 90 Pulse  Avg: 90  Min: 90  Max: 90  RR 16 Resp  Avg: 16  Min: 16  Max: 16  SaO2     No data recorded       24 Hour I/O Current Shift I/O  Time Ins Outs No intake/output data recorded. No intake/output data recorded.   EFM: ***  Toco: ***  General: Well nourished, well developed female in no acute distress.  Skin:  Warm and dry.  Cardiovascular: {EXAM; PPIRJ:18841} Respiratory:  Clear to auscultation bilateral. Normal respiratory effort Abdomen: *** Neuro/Psych:  Normal mood and affect.   SSE: *** SVE: *** Leopolds/EFW: ***  Labs  *** No results for input(s): WBC, HGB, HCT, PLT in the last 168 hours.  No results for input(s): NA, K, CL, CO2, BUN, CREATININE, CALCIUM, PROT, BILITOT, ALKPHOS, ALT, AST, GLUCOSE in the last 168 hours.  Invalid input(s): LABALBU  Radiology ***  Perinatal info  O POS/ Rubella  {Desc;  immune/not/unknown:31571} / Varicella {Desc; immune/not/unknown:31571}/RPR ***/HIV {Desc; negative/positive:13464::"negative"}/HepB Surf Ag {Desc; negative/positive:13464::"negative"}/TDaP:{YES YS:06301} /pap ***/  ABO, Rh: --/--/O POS (12/22 0840) Antibody: NEG (12/22 0840) Rubella: 3.96 (  07/19 1600) RPR: NON REACTIVE (12/22 0847)  HBsAg: NON REACTIVE (07/19 1600)  HIV: Non Reactive (01/05 1048)  PQA:ESLPNPYY/-- (01/05 1120) 1 hr Glucola  *** Genetic screening {Normal/abnormal wildcard:19619} Anatomy US {Normal/abnormal wildcard:19619} ***  Maternal Diabetes: {Maternal Diabetes:3043596} Genetic Screening: {Genetic Screening:20205} Maternal Ultrasounds/Referrals: {Maternal Ultrasounds / Referrals:20211} Fetal Ultrasounds or other Referrals:  {Fetal Ultrasounds or Other Referrals:20213} Maternal Substance Abuse:  {Maternal Substance Abuse:20223} Significant Maternal Medications:  {Significant Maternal Meds:20233} Significant Maternal Lab Results: {Significant Maternal Lab Results:20235} ***  Assessment & Plan  *** 30 y.o. F1T0211 @ [redacted]w[redacted]d with *** *Pregnancy: *** *GBS: *** *Analgesia: *** * ***  Cornelia Copa MD Attending Center for [facility])

## 2020-03-22 DIAGNOSIS — O9853 Other viral diseases complicating the puerperium: Secondary | ICD-10-CM

## 2020-03-22 DIAGNOSIS — U071 COVID-19: Secondary | ICD-10-CM

## 2020-03-22 DIAGNOSIS — Z720 Tobacco use: Secondary | ICD-10-CM

## 2020-03-22 DIAGNOSIS — Z8759 Personal history of other complications of pregnancy, childbirth and the puerperium: Secondary | ICD-10-CM

## 2020-03-22 DIAGNOSIS — D649 Anemia, unspecified: Secondary | ICD-10-CM

## 2020-03-22 DIAGNOSIS — O9903 Anemia complicating the puerperium: Secondary | ICD-10-CM

## 2020-03-22 DIAGNOSIS — O99335 Smoking (tobacco) complicating the puerperium: Secondary | ICD-10-CM

## 2020-03-22 MED ORDER — OXYCODONE HCL 5 MG PO TABS
5.0000 mg | ORAL_TABLET | ORAL | Status: AC
  Administered 2020-03-22: 5 mg via ORAL
  Filled 2020-03-22: qty 1

## 2020-03-22 MED ORDER — IBUPROFEN 600 MG PO TABS: 600.0000 mg | ORAL_TABLET | Freq: Four times a day (QID) | ORAL | 0 refills | Status: DC

## 2020-03-22 NOTE — Social Work (Signed)
CSW has already acknowledged and completed full assessment for [facility].   CSW will continue to follow CDS and make a CPS report if warranted. CSW identifies no further need for intervention and no barriers to discharge at this time.  Manfred Arch, MSW, [employer] Clinical Social Work [employer] and [employer] [phone]

## 2020-03-23 LAB — SURGICAL PATHOLOGY

## 2020-03-23 NOTE — Discharge Summary (Signed)
Postpartum Discharge Summary       Patient Name: Terri Herrera DOB: Jun 17, 1990 MRN: [id number]  Date of admission: 03/20/2020 Delivery date:03/21/2020  Delivering provider: Arrie Senate  Date of discharge: 03/23/2020  Admitting diagnosis: Preterm uterine contractions [O47.00] Intrauterine pregnancy: [redacted]w[redacted]d     Secondary diagnosis:  Active Problems:   Preterm delivery   History of prior pregnancy with SGA newborn   Tobacco abuse   Supervision of other normal pregnancy, antepartum   Tobacco use in pregnancy, antepartum   Limited prenatal care   Anemia in pregnancy, third trimester   Preterm uterine contractions   Non-stress test reactive   IUD (intrauterine device) in place Asymptomatic Covid  Additional problems: none    Discharge diagnosis: Preterm Pregnancy Delivered                                              Post partum procedures: post placental liletta placed Augmentation: AROM Complications: None  Hospital course: Onset of Labor With Vaginal Delivery      30 y.o. yo [id number] at [redacted]w[redacted]d was admitted in Latent Labor on 03/20/2020. Patient presented to the MAU in latent labor and was found to be 4cm dilation. NRNST in MAU so BPP obtained, 6/8. Decision made to admit patient to L&D for suspected latent labor and observation. Patient spontaneously progressed to 8cm and was having prolonged decelerations so decision made to AROM patient at that time. Patient then spontaneously delivered without difficulty. Patient had an uncomplicated labor course as follows:  Membrane Rupture Time/Date: 3:17 AM ,03/21/2020   Delivery Method:Vaginal, Spontaneous  Episiotomy: None  Lacerations:  None  Patient had an uncomplicated postpartum course.  She is ambulating, tolerating a regular diet, passing flatus, and urinating well. Patient is discharged home in stable condition on 03/23/20.  Newborn Data: Birth date:03/21/2020  Birth time:3:40 AM  Gender:Female  Living status:Living   Apgars:9 ,9  Weight:2495 g   Magnesium Sulfate received: No BMZ received: No Rhophylac:N/A MMR:N/A T-DaP:Given prenatally Flu: No Transfusion:No  Physical exam  Vitals:   03/22/20 0626 03/22/20 1515 03/22/20 1940 03/23/20 0510  BP: (!) 102/58 105/62 103/60 102/70  Pulse: 70 62 67 69  Resp: $Remo'18 18 18 16  'dpGyk$ Temp: 97.8 F (36.6 C) 98.3 F (36.8 C) 98.3 F (36.8 C) 98 F (36.7 C)  TempSrc: Oral Oral Oral   SpO2:  100% 100% 99%  Weight:      Height:       General: alert, cooperative and no distress Lochia: appropriate Uterine Fundus: firm Incision: Healing well with no significant drainage DVT Evaluation: No evidence of DVT seen on physical exam. Labs: Lab Results  Component Value Date   WBC 7.2 03/21/2020   HGB 11.3 (L) 03/21/2020   HCT 32.0 (L) 03/21/2020   MCV 97.6 03/21/2020   PLT 252 03/21/2020   CMP Latest Ref Rng & Units 01/06/2020  Glucose 70 - 99 mg/dL 92  BUN 6 - 20 mg/dL <5(L)  Creatinine 0.44 - 1.00 mg/dL 0.49  Sodium 135 - 145 mmol/L 136  Potassium 3.5 - 5.1 mmol/L 3.7  Chloride 98 - 111 mmol/L 106  CO2 22 - 32 mmol/L 20(L)  Calcium 8.9 - 10.3 mg/dL 9.0  Total Protein 6.5 - 8.1 g/dL 6.3(L)  Total Bilirubin 0.3 - 1.2 mg/dL 0.8  Alkaline Phos 38 - 126 U/L 26(L)  AST  15 - 41 U/L 27  ALT 0 - 44 U/L 32   Edinburgh Score: Edinburgh Postnatal Depression Scale Screening Tool 03/22/2020  I have been able to laugh and see the funny side of things. 0  I have looked forward with enjoyment to things. 0  I have blamed myself unnecessarily when things went wrong. 0  I have been anxious or worried for no good reason. 1  I have felt scared or panicky for no good reason. 0  Things have been getting on top of me. 0  I have been so unhappy that I have had difficulty sleeping. 0  I have felt sad or miserable. 0  I have been so unhappy that I have been crying. 0  The thought of harming myself has occurred to me. 0  Edinburgh Postnatal Depression Scale Total 1      After visit meds:  Allergies as of 03/23/2020      Reactions   Acetaminophen Itching   No reaction; pt prefers not to take      Medication List    TAKE these medications   ferrous sulfate 325 (65 FE) MG tablet Take 1 tablet (325 mg total) by mouth every other day.   ibuprofen 600 MG tablet Commonly known as: ADVIL Take 1 tablet (600 mg total) by mouth every 6 (six) hours.   Prenatal Vitamin 27-0.8 MG Tabs Take 1 tablet by mouth at bedtime.   sennosides-docusate sodium 8.6-50 MG tablet Commonly known as: SENOKOT-S Take 1 tablet by mouth daily.        Discharge home in stable condition Infant Feeding: Bottle Infant Disposition:home with mother Discharge instruction: per After Visit Summary and Postpartum booklet. Activity: Advance as tolerated. Pelvic rest for 6 weeks.  Diet: routine diet Future Appointments: Future Appointments  Date Time Provider [place]  04/11/2020  3:55 PM Aletha Halim, MD [facility]   Follow up Visit: Message sent to [facility] 03/21/20 by Sylvester Harder.   Please schedule this patient for a In person postpartum visit in 4 weeks with the following provider: Any provider. Additional Postpartum F/U: none   High risk pregnancy complicated by:  etoh use, tobacco use, limited PNC Delivery mode:  Vaginal, Spontaneous  Anticipated Birth Control:  PP IUD placed, string check at pp visit   03/23/2020 Janet Berlin, MD

## 2020-03-24 ENCOUNTER — Other Ambulatory Visit: Payer: Medicaid Other

## 2020-03-24 ENCOUNTER — Encounter: Payer: Medicaid Other | Admitting: Obstetrics and Gynecology

## 2020-03-31 ENCOUNTER — Encounter: Payer: Medicaid Other | Admitting: Obstetrics & Gynecology

## 2020-03-31 ENCOUNTER — Other Ambulatory Visit: Payer: Medicaid Other

## 2020-04-07 ENCOUNTER — Encounter: Payer: Medicaid Other | Admitting: Obstetrics and Gynecology

## 2020-04-07 ENCOUNTER — Other Ambulatory Visit: Payer: Medicaid Other

## 2020-04-11 ENCOUNTER — Encounter: Payer: Self-pay | Admitting: Obstetrics and Gynecology

## 2020-04-11 ENCOUNTER — Ambulatory Visit: Payer: Medicaid Other | Admitting: Obstetrics and Gynecology

## 2020-04-11 NOTE — Progress Notes (Signed)
Patient did not keep her postpartum appointment for 04/11/2020.  Cornelia Copa MD Attending Center for [employer] [facility])

## 2020-04-14 ENCOUNTER — Encounter: Payer: Medicaid Other | Admitting: Family Medicine

## 2020-04-14 ENCOUNTER — Other Ambulatory Visit: Payer: Medicaid Other

## 2020-04-21 ENCOUNTER — Encounter: Payer: Medicaid Other | Admitting: Obstetrics & Gynecology

## 2020-04-21 ENCOUNTER — Other Ambulatory Visit: Payer: Medicaid Other

## 2020-08-23 ENCOUNTER — Ambulatory Visit: Payer: Medicaid Other | Admitting: Podiatry

## 2021-05-17 ENCOUNTER — Emergency Department (HOSPITAL_COMMUNITY)
Admission: EM | Admit: 2021-05-17 | Discharge: 2021-05-17 | Disposition: A | Discharge status: Home / Self Care | Payer: Medicaid Other | Attending: Emergency Medicine | Admitting: Emergency Medicine

## 2021-05-17 ENCOUNTER — Encounter (HOSPITAL_COMMUNITY): Payer: Self-pay | Admitting: Emergency Medicine

## 2021-05-17 ENCOUNTER — Emergency Department (HOSPITAL_COMMUNITY): Payer: Medicaid Other

## 2021-05-17 ENCOUNTER — Other Ambulatory Visit: Payer: Self-pay

## 2021-05-17 DIAGNOSIS — Z79899 Other long term (current) drug therapy: Secondary | ICD-10-CM | POA: Diagnosis not present

## 2021-05-17 DIAGNOSIS — N9489 Other specified conditions associated with female genital organs and menstrual cycle: Secondary | ICD-10-CM | POA: Diagnosis not present

## 2021-05-17 DIAGNOSIS — B9689 Other specified bacterial agents as the cause of diseases classified elsewhere: Secondary | ICD-10-CM | POA: Insufficient documentation

## 2021-05-17 DIAGNOSIS — N760 Acute vaginitis: Secondary | ICD-10-CM | POA: Diagnosis not present

## 2021-05-17 DIAGNOSIS — R1012 Left upper quadrant pain: Secondary | ICD-10-CM

## 2021-05-17 DIAGNOSIS — N898 Other specified noninflammatory disorders of vagina: Secondary | ICD-10-CM

## 2021-05-17 DIAGNOSIS — J029 Acute pharyngitis, unspecified: Secondary | ICD-10-CM | POA: Diagnosis not present

## 2021-05-17 LAB — COMPREHENSIVE METABOLIC PANEL
ALT: 26 U/L (ref 0–44)
AST: 26 U/L (ref 15–41)
Albumin: 4.2 g/dL (ref 3.5–5.0)
Alkaline Phosphatase: 24 U/L — ABNORMAL LOW (ref 38–126)
Anion gap: 14 (ref 5–15)
BUN: 14 mg/dL (ref 6–20)
CO2: 21 mmol/L — ABNORMAL LOW (ref 22–32)
Calcium: 9.4 mg/dL (ref 8.9–10.3)
Chloride: 96 mmol/L — ABNORMAL LOW (ref 98–111)
Creatinine, Ser: 0.94 mg/dL (ref 0.44–1.00)
GFR, Estimated: >60 mL/min (ref >60)
Glucose, Bld: 94 mg/dL (ref 70–99)
Potassium: 3.6 mmol/L (ref 3.5–5.1)
Sodium: 131 mmol/L — ABNORMAL LOW (ref 135–145)
Total Bilirubin: 0.7 mg/dL (ref 0.3–1.2)
Total Protein: 8.5 g/dL — ABNORMAL HIGH (ref 6.5–8.1)

## 2021-05-17 LAB — TYPE AND SCREEN
ABO/RH(D): O POS
Antibody Screen: NEGATIVE

## 2021-05-17 LAB — WET PREP, GENITAL
Sperm: NONE SEEN
Trich, Wet Prep: NONE SEEN
WBC, Wet Prep HPF POC: <10 (ref <10)
Yeast Wet Prep HPF POC: NONE SEEN

## 2021-05-17 LAB — CBC WITH DIFFERENTIAL/PLATELET
Abs Immature Granulocytes: 0.03 10*3/uL (ref 0.00–0.07)
Basophils Absolute: 0 10*3/uL (ref 0.0–0.1)
Basophils Relative: 0 %
Eosinophils Absolute: 0.1 10*3/uL (ref 0.0–0.5)
Eosinophils Relative: 1 %
HCT: 43.1 % (ref 36.0–46.0)
Hemoglobin: 14.5 g/dL (ref 12.0–15.0)
Immature Granulocytes: 0 %
Lymphocytes Relative: 12 %
Lymphs Abs: 1.3 10*3/uL (ref 0.7–4.0)
MCH: 34.3 pg — ABNORMAL HIGH (ref 26.0–34.0)
MCHC: 33.6 g/dL (ref 30.0–36.0)
MCV: 101.9 fL — ABNORMAL HIGH (ref 80.0–100.0)
Monocytes Absolute: 0.6 10*3/uL (ref 0.1–1.0)
Monocytes Relative: 6 %
Neutro Abs: 8.6 10*3/uL — ABNORMAL HIGH (ref 1.7–7.7)
Neutrophils Relative %: 81 %
Platelets: 280 10*3/uL (ref 150–400)
RBC: 4.23 MIL/uL (ref 3.87–5.11)
RDW: 12.6 % (ref 11.5–15.5)
WBC: 10.6 10*3/uL — ABNORMAL HIGH (ref 4.0–10.5)
nRBC: 0 % (ref 0.0–0.2)

## 2021-05-17 LAB — ETHANOL: Alcohol, Ethyl (B): <10 mg/dL (ref <10)

## 2021-05-17 LAB — URINALYSIS, ROUTINE W REFLEX MICROSCOPIC
Bilirubin Urine: NEGATIVE
Glucose, UA: NEGATIVE mg/dL
Hgb urine dipstick: NEGATIVE
Ketones, ur: 20 mg/dL — AB
Leukocytes,Ua: NEGATIVE
Nitrite: NEGATIVE
Protein, ur: 30 mg/dL — AB
Specific Gravity, Urine: >1.046 — ABNORMAL HIGH (ref 1.005–1.030)
pH: 6 (ref 5.0–8.0)

## 2021-05-17 LAB — RAPID URINE DRUG SCREEN, HOSP PERFORMED
Amphetamines: NOT DETECTED
Barbiturates: NOT DETECTED
Benzodiazepines: NOT DETECTED
Cocaine: NOT DETECTED
Opiates: NOT DETECTED
Tetrahydrocannabinol: NOT DETECTED

## 2021-05-17 LAB — LIPASE, BLOOD: Lipase: 24 U/L (ref 11–51)

## 2021-05-17 LAB — RPR: RPR Ser Ql: NONREACTIVE

## 2021-05-17 LAB — HIV ANTIBODY (ROUTINE TESTING W REFLEX): HIV Screen 4th Generation wRfx: NONREACTIVE

## 2021-05-17 LAB — I-STAT BETA HCG BLOOD, ED (MC, WL, AP ONLY): I-stat hCG, quantitative: <5 m[IU]/mL (ref <5)

## 2021-05-17 MED ORDER — METRONIDAZOLE 500 MG PO TABS: 500.0000 mg | ORAL_TABLET | Freq: Two times a day (BID) | ORAL | 0 refills | Status: DC

## 2021-05-17 MED ORDER — KETOROLAC TROMETHAMINE 15 MG/ML IJ SOLN
15.0000 mg | Freq: Once | INTRAMUSCULAR | Status: AC
  Administered 2021-05-17: 15 mg via INTRAVENOUS
  Filled 2021-05-17: qty 1

## 2021-05-17 MED ORDER — ONDANSETRON 4 MG PO TBDP
4.0000 mg | ORAL_TABLET | Freq: Once | ORAL | Status: DC
  Filled 2021-05-17: qty 1

## 2021-05-17 MED ORDER — IOHEXOL 300 MG/ML  SOLN
100.0000 mL | Freq: Once | INTRAMUSCULAR | Status: AC | PRN
  Administered 2021-05-17: 100 mL via INTRAVENOUS

## 2021-05-17 NOTE — ED Provider Notes (Signed)
[facility] EMERGENCY DEPARTMENT Provider Note   CSN: [id number] Arrival date & time: 05/17/21  0120     History  Chief Complaint  Patient presents with   Abdominal Pain    Terri Herrera is a 31 y.o. female.  31 yo F with a chief complaints of abdominal pain and vomiting.  She tells me that she has been sick for about a week.  Started with some pain in her mouth and throat felt like she had an abscess that had spontaneously opened up and drained.  She then developed some nausea and vomiting and some pain to the left upper quadrant.  She also noticed that she has had some vaginal bleeding which is atypical for her and has had some discharge as well.  Denies any urinary symptoms.  Denies fevers or chills.   Abdominal Pain     Home Medications Prior to Admission medications   Medication Sig Start Date End Date Taking? Authorizing Provider  metroNIDAZOLE (FLAGYL) 500 MG tablet Take 1 tablet (500 mg total) by mouth 2 (two) times daily. 05/17/21  Yes Deno Etienne, DO  ferrous sulfate 325 (65 FE) MG tablet Take 1 tablet (325 mg total) by mouth every other day. Patient not taking: Reported on 03/16/2020 03/02/20 03/02/21  Randa Ngo, MD  ibuprofen (ADVIL) 600 MG tablet Take 1 tablet (600 mg total) by mouth every 6 (six) hours. 03/22/20   Janet Berlin, MD  Prenatal Vit-Fe Fumarate-FA (PRENATAL VITAMIN) 27-0.8 MG TABS Take 1 tablet by mouth at bedtime. 03/02/20   Randa Ngo, MD  sennosides-docusate sodium (SENOKOT-S) 8.6-50 MG tablet Take 1 tablet by mouth daily. Patient not taking: Reported on 03/16/2020 01/06/20   Gavin Pound, CNM      Allergies    Acetaminophen    Review of Systems   Review of Systems  Gastrointestinal:  Positive for abdominal pain.   Physical Exam Updated Vital Signs BP 115/84 (BP Location: Left Arm)    Pulse 76    Temp 98.2 F (36.8 C) (Oral)    Resp 18    SpO2 98%  Physical Exam Vitals and nursing note reviewed.  Constitutional:       General: She is not in acute distress.    Appearance: She is well-developed. She is not diaphoretic.  HENT:     Head: Normocephalic and atraumatic.     Comments: Swollen turbinates, posterior nasal drip, bilateral anterior cervical lymphadenopathy Eyes:     Pupils: Pupils are equal, round, and reactive to light.  Cardiovascular:     Rate and Rhythm: Normal rate and regular rhythm.     Heart sounds: No murmur heard.   No friction rub. No gallop.  Pulmonary:     Effort: Pulmonary effort is normal.     Breath sounds: No wheezing or rales.  Abdominal:     General: There is no distension.     Palpations: Abdomen is soft.     Tenderness: There is abdominal tenderness.     Comments: Diffuse abdominal discomfort  Genitourinary:    Comments: Some brownish appearing discharge erythema at the cervix.  No cervical motion tenderness no adnexal tenderness or masses. Musculoskeletal:        General: No tenderness.     Cervical back: Normal range of motion and neck supple.  Skin:    General: Skin is warm and dry.  Neurological:     Mental Status: She is alert and oriented to person, place, and time.  Psychiatric:        Behavior: Behavior normal.    ED Results / Procedures / Treatments   Labs (all labs ordered are listed, but only abnormal results are displayed) Labs Reviewed  WET PREP, GENITAL - Abnormal; Notable for the following components:      Result Value   Clue Cells Wet Prep HPF POC PRESENT (*)    All other components within normal limits  COMPREHENSIVE METABOLIC PANEL - Abnormal; Notable for the following components:   Sodium 131 (*)    Chloride 96 (*)    CO2 21 (*)    Total Protein 8.5 (*)    Alkaline Phosphatase 24 (*)    All other components within normal limits  CBC WITH DIFFERENTIAL/PLATELET - Abnormal; Notable for the following components:   WBC 10.6 (*)    MCV 101.9 (*)    MCH 34.3 (*)    Neutro Abs 8.6 (*)    All other components within normal limits   URINALYSIS, ROUTINE W REFLEX MICROSCOPIC - Abnormal; Notable for the following components:   Specific Gravity, Urine >1.046 (*)    Ketones, ur 20 (*)    Protein, ur 30 (*)    All other components within normal limits  LIPASE, BLOOD  RAPID URINE DRUG SCREEN, HOSP PERFORMED  ETHANOL  HIV ANTIBODY (ROUTINE TESTING W REFLEX)  RAPID HIV SCREEN (HIV 1/2 AB+AG)  RPR  I-STAT BETA HCG BLOOD, ED ([facility], [facility], [facility] ONLY)  TYPE AND SCREEN  GC/CHLAMYDIA PROBE AMP ([place]) NOT AT [facility]    EKG None  Radiology CT ABDOMEN PELVIS W CONTRAST  Result Date: 05/17/2021 CLINICAL DATA:  Epigastric pain EXAM: CT ABDOMEN AND PELVIS WITH CONTRAST TECHNIQUE: Multidetector CT imaging of the abdomen and pelvis was performed using the standard protocol following bolus administration of intravenous contrast. RADIATION DOSE REDUCTION: This exam was performed according to the departmental dose-optimization program which includes automated exposure control, adjustment of the mA and/or kV according to patient size and/or use of iterative reconstruction technique. CONTRAST:  178mL OMNIPAQUE IOHEXOL 300 MG/ML  SOLN COMPARISON:  03/07/2010 FINDINGS: Lower chest: No acute abnormality. Hepatobiliary: No focal liver abnormality is seen. No gallstones, gallbladder wall thickening, or biliary dilatation. Pancreas: Unremarkable. No pancreatic ductal dilatation or surrounding inflammatory changes. Spleen: Normal in size without focal abnormality. Adrenals/Urinary Tract: Adrenal glands are within normal limits. Kidneys demonstrate a normal enhancement pattern bilaterally. No renal calculi or obstructive changes are seen. Bladder is partially distended. Stomach/Bowel: The appendix is well visualized and within normal limits. No obstructive or inflammatory changes in the colon are seen. Small bowel and stomach are within normal limits. Vascular/Lymphatic: No significant vascular findings are present. No enlarged abdominal or pelvic lymph nodes.  Reproductive: Uterus and bilateral adnexa are unremarkable. IUD is noted in place but appears inverted within the uterus. Other: No abdominal wall hernia or abnormality. No abdominopelvic ascites. Musculoskeletal: No acute or significant osseous findings. IMPRESSION: No acute abnormality noted. Electronically Signed   By: Inez Catalina M.D.   On: 05/17/2021 03:55    Procedures Procedures    Medications Ordered in ED Medications  ondansetron (ZOFRAN-ODT) disintegrating tablet 4 mg (has no administration in time range)  iohexol (OMNIPAQUE) 300 MG/ML solution 100 mL (100 mLs Intravenous Contrast Given 05/17/21 0341)  ketorolac (TORADOL) 15 MG/ML injection 15 mg (15 mg Intravenous Given 05/17/21 0901)    ED Course/ Medical Decision Making/ A&P  Medical Decision Making Amount and/or Complexity of Data Reviewed Labs: ordered.  Risk Prescription drug management.   24 yoF with a chief complaint of abdominal pain nausea and vomiting.  This been going on for about a week now.  She has signs of an upper respiratory illness on exam with swollen turbinates posterior nasal drip and some anterior cervical lymphadenopathy.  She has diffuse abdominal discomfort as well.  LFTs and lipase are unremarkable no significant electrolyte abnormality no significant anemia CT scan of the abdomen pelvis without acute intra-abdominal pathology.  She has some pelvic complaints and so will perform a pelvic exam.  Has a history of gonorrhea we will send off HIV and syphilis testing.  No cervical motion tenderness no adnexal tenderness.  Do not feel she benefit from imaging by ultrasound.  Wet prep with BV.  Will start on antibiotic therapy.  Have her follow-up with GYN.  9:09 AM:  I have discussed the diagnosis/risks/treatment options with the patient.  Evaluation and diagnostic testing in the emergency department does not suggest an emergent condition requiring admission or immediate intervention  beyond what has been performed at this time.  They will follow up with  PCP, GYN. We also discussed returning to the ED immediately if new or worsening sx occur. We discussed the sx which are most concerning (e.g., sudden worsening pain, fever, inability to tolerate by mouth) that necessitate immediate return. Medications administered to the patient during their visit and any new prescriptions provided to the patient are listed below.  Medications given during this visit Medications  ondansetron (ZOFRAN-ODT) disintegrating tablet 4 mg (has no administration in time range)  iohexol (OMNIPAQUE) 300 MG/ML solution 100 mL (100 mLs Intravenous Contrast Given 05/17/21 0341)  ketorolac (TORADOL) 15 MG/ML injection 15 mg (15 mg Intravenous Given 05/17/21 0901)     The patient appears reasonably screen and/or stabilized for discharge and I doubt any other medical condition or other [facility] requiring further screening, evaluation, or treatment in the ED at this time prior to discharge.          Final Clinical Impression(s) / ED Diagnoses Final diagnoses:  Sore throat  Left upper quadrant abdominal pain  Discharge from the vagina  BV (bacterial vaginosis)    Rx / DC Orders ED Discharge Orders          Ordered    metroNIDAZOLE (FLAGYL) 500 MG tablet  2 times daily        05/17/21 [place], Axis, DO 05/17/21 [id number]

## 2021-05-17 NOTE — ED Triage Notes (Signed)
Pt brought to ED triage via wheelchair by [facility] with c/o abdominal pain and blood tinged emesis x2 days.  ?

## 2021-05-17 NOTE — ED Notes (Signed)
Pelvic cart to bedside, ice chips provided. ?

## 2021-05-17 NOTE — Discharge Instructions (Signed)
It does look like you have bacterial vaginosis.  The treatment for this typically is antibiotics.  This can be very sick if you drink alcohol or even use mouthwash with alcohol in it.  Try to avoid using that kind of substance until your antibiotics are completed.  Please follow-up with GYN. ? ? ?

## 2021-05-17 NOTE — ED Provider Triage Note (Signed)
Emergency Medicine Provider Triage Evaluation Note ? ?Terri Herrera , a 31 y.o. female  was evaluated in triage.  Pt complains of epigastric abdominal pain nausea, vomiting, and hematemesis.  Patient reports that she has had epigastric abdominal pain over the last 3 days.  Pain is constant briefly worse over this time.  Reports that she has had nausea and vomiting over the last 3 days as well.  Patient reports that today she had 4 episodes of hematemesis.  Patient reports noting bright red blood.  Patient is unable to quantify the exact amount however states "it filled up the toilet bowl."  Additionally patient endorses diarrhea. ? ?Denies any frequent NSAID use.  Endorses daily drinking of 7-8 beers.  Patient has not had any alcohol in the last 3 days due to her symptoms.  Denies any illicit drug use.  No history of abdominal surgeries.  Patient is not on any blood thinners. ? ?Review of Systems  ?Positive: Abdominal pain, nausea, vomiting, diarrhea, hematemesis ?Negative: Fever, chills, constipation, blood in stool, melena, lightheadedness, syncope ? ?Physical Exam  ?BP 134/89 (BP Location: Right Arm)   Pulse (!) 116   Temp 98.4 ?F (36.9 ?C) (Oral)   Resp 18   SpO2 100%  ?Gen:   Awake, no distress   ?Resp:  Normal effort  ?MSK:   Moves extremities without difficulty  ?Other:  Abdomen soft, nondistended, tenderness to epigastric area. ? ?Medical Decision Making  ?Medically screening exam initiated at 1:27 AM.  Appropriate orders placed.  Terri Herrera was informed that the remainder of the evaluation will be completed by another provider, this initial triage assessment does not replace that evaluation, and the importance of remaining in the ED until their evaluation is complete. ? ? ?  ?Haskel Schroeder, PA-C ?05/17/21 0129 ? ?

## 2021-05-18 LAB — GC/CHLAMYDIA PROBE AMP (~~LOC~~) NOT AT ARMC
Chlamydia: NEGATIVE
Comment: NEGATIVE
Comment: NORMAL
Neisseria Gonorrhea: NEGATIVE

## 2022-03-18 ENCOUNTER — Encounter (HOSPITAL_COMMUNITY): Payer: Self-pay | Admitting: Pharmacy Technician

## 2022-03-18 ENCOUNTER — Emergency Department (HOSPITAL_COMMUNITY)
Admission: EM | Admit: 2022-03-18 | Discharge: 2022-03-18 | Disposition: A | Discharge status: Home / Self Care | Payer: BLUE CROSS/BLUE SHIELD | Attending: Emergency Medicine | Admitting: Emergency Medicine

## 2022-03-18 DIAGNOSIS — J020 Streptococcal pharyngitis: Secondary | ICD-10-CM | POA: Diagnosis not present

## 2022-03-18 DIAGNOSIS — R059 Cough, unspecified: Secondary | ICD-10-CM | POA: Diagnosis present

## 2022-03-18 DIAGNOSIS — K029 Dental caries, unspecified: Secondary | ICD-10-CM | POA: Diagnosis not present

## 2022-03-18 DIAGNOSIS — Z20822 Contact with and (suspected) exposure to covid-19: Secondary | ICD-10-CM | POA: Diagnosis not present

## 2022-03-18 LAB — GROUP A STREP BY PCR: Group A Strep by PCR: DETECTED — AB

## 2022-03-18 LAB — RESP PANEL BY RT-PCR (RSV, FLU A&B, COVID)  RVPGX2
Influenza A by PCR: NEGATIVE
Influenza B by PCR: NEGATIVE
Resp Syncytial Virus by PCR: NEGATIVE
SARS Coronavirus 2 by RT PCR: NEGATIVE

## 2022-03-18 MED ORDER — PENICILLIN G BENZATHINE 1200000 UNIT/2ML IM SUSY
1.2000 10*6.[IU] | PREFILLED_SYRINGE | Freq: Once | INTRAMUSCULAR | Status: AC
  Administered 2022-03-18: 1.2 10*6.[IU] via INTRAMUSCULAR
  Filled 2022-03-18: qty 2

## 2022-03-18 MED ORDER — IBUPROFEN 400 MG PO TABS
600.0000 mg | ORAL_TABLET | Freq: Once | ORAL | Status: AC
  Administered 2022-03-18: 600 mg via ORAL
  Filled 2022-03-18: qty 1

## 2022-03-18 NOTE — ED Provider Triage Note (Signed)
Emergency Medicine Provider Triage Evaluation Note  Terri Herrera , a 32 y.o. female  was evaluated in triage.  Pt complains of flulike symptoms since Saturday, and dental pain for 1 week.  Recent sick contacts, her daughter recently diagnosed with flu and strep throat.  Endorses sore throat, chills, body aches.  Denies chest pain, shortness of breath, or N/V/D.  Review of Systems  Positive:  Negative: See above  Physical Exam  There were no vitals taken for this visit. Gen:   Awake, no distress   Resp:  Normal effort  MSK:   Moves extremities without difficulty  Other:  Uncomfortable appearing.  Airway patent.  Mild erythema posterior oropharynx.  Mild tenderness along the right maxillary gumline near tooth #4/5, without evidence of obvious abscess.  No cardiac murmur.  Communicates without difficulty.  Medical Decision Making  Medically screening exam initiated at 11:08 AM.  Appropriate orders placed.  TYRINA HINES was informed that the remainder of the evaluation will be completed by another provider, this initial triage assessment does not replace that evaluation, and the importance of remaining in the ED until their evaluation is complete.     Prince Rome, PA-C 65/03/54 1114

## 2022-03-18 NOTE — ED Provider Notes (Signed)
[facility] EMERGENCY DEPARTMENT Provider Note   CSN: [id number] Arrival date & time: 03/18/22  0841     History  Chief Complaint  Patient presents with   URI   Dental Pain    Terri Herrera is a 32 y.o. female.  Patient is a 32 year old female who presents with URI symptoms.  She reports that she started feeling bad yesterday.  She has nasal congestion sore throat, runny nose and coughing.  No known fevers.  She has been having myalgias.  No shortness of breath.  No vomiting or diarrhea.  She is here with her daughter who was evaluated in the pediatric ED.  Reportedly she tested positive for both strep and flu.  Patient also notes a tooth ache that she has had for about the last week to her right lower tooth.       Home Medications Prior to Admission medications   Medication Sig Start Date End Date Taking? Authorizing Provider  ferrous sulfate 325 (65 FE) MG tablet Take 1 tablet (325 mg total) by mouth every other day. Patient not taking: Reported on 03/16/2020 03/02/20 03/02/21  Sheila Oats, MD      Allergies    Acetaminophen    Review of Systems   Review of Systems  Constitutional:  Positive for chills and fatigue. Negative for diaphoresis and fever.  HENT:  Positive for congestion, dental problem, rhinorrhea and sore throat. Negative for sneezing, trouble swallowing and voice change.   Eyes: Negative.   Respiratory:  Positive for cough. Negative for chest tightness and shortness of breath.   Cardiovascular:  Negative for chest pain and leg swelling.  Gastrointestinal:  Negative for abdominal pain, blood in stool, diarrhea, nausea and vomiting.  Genitourinary:  Negative for difficulty urinating, flank pain, frequency and hematuria.  Musculoskeletal:  Positive for myalgias. Negative for arthralgias and back pain.  Skin:  Negative for rash.  Neurological:  Positive for headaches. Negative for dizziness, speech difficulty, weakness and numbness.     Physical Exam Updated Vital Signs BP 125/89 (BP Location: Right Arm)   Pulse 85   Temp 99 F (37.2 C) (Oral)   Resp 15   SpO2 100%  Physical Exam  ED Results / Procedures / Treatments   Labs (all labs ordered are listed, but only abnormal results are displayed) Labs Reviewed  GROUP A STREP BY PCR - Abnormal; Notable for the following components:      Result Value   Group A Strep by PCR DETECTED (*)    All other components within normal limits  RESP PANEL BY RT-PCR (RSV, FLU A&B, COVID)  RVPGX2    EKG None  Radiology No results found.  Procedures Procedures    Medications Ordered in ED Medications  penicillin g benzathine (BICILLIN LA) 1200000 UNIT/2ML injection 1.2 Million Units (1.2 Million Units Intramuscular Given 03/18/22 1237)  ibuprofen (ADVIL) tablet 600 mg (600 mg Oral Given 03/18/22 1237)    ED Course/ Medical Decision Making/ A&P                           Medical Decision Making Risk Prescription drug management.   Patient is a 31 year old female who presents with sore throat and URI symptoms as well as a tooth ache.  Her strep test is positive.  She does not have any suggestions of peritonsillar abscess on clinical exam.  She is otherwise well-appearing.  Her COVID/flu/RSV test was negative.  She appears  to be well-hydrated.  No hypoxia.  I feel that she can be treated as an outpatient.  She was given a shot of similar after we discussed options of oral versus IM injection antibiotics.  She decided on the IM antibiotics.  This should cover both the strep throat and potential dental infection.  I do not see any induration or fluctuance that would be more concerning for dental abscess.  Discussed following up with a dentist.  Gave her dental resources.  Return precautions given.  Symptomatic care instructions discussed.  Final Clinical Impression(s) / ED Diagnoses Final diagnoses:  Streptococcal pharyngitis  Dental caries    Rx / DC Orders ED Discharge  Orders     None         Malvin Johns, MD 03/18/22 1849

## 2022-03-18 NOTE — ED Notes (Signed)
Pt not answering to be triaged  

## 2022-03-18 NOTE — Discharge Instructions (Addendum)
You can use ibuprofen for symptomatic relief.  You will need to follow-up with a dentist regarding your tooth.  Return to emergency room if you have any worsening symptoms such as increased swelling in your throat, difficulty swallowing, ongoing vomiting or other worsening symptoms.

## 2022-03-18 NOTE — ED Triage Notes (Signed)
Pt here with sore throat and cough since yesterday. Pt states daughter has strep throat and the flu.

## 2022-05-21 ENCOUNTER — Emergency Department (HOSPITAL_COMMUNITY)
Admission: EM | Admit: 2022-05-21 | Discharge: 2022-05-21 | Disposition: A | Discharge status: Home / Self Care | Payer: BLUE CROSS/BLUE SHIELD | Attending: Emergency Medicine | Admitting: Emergency Medicine

## 2022-05-21 ENCOUNTER — Other Ambulatory Visit: Payer: Self-pay

## 2022-05-21 DIAGNOSIS — J020 Streptococcal pharyngitis: Secondary | ICD-10-CM | POA: Insufficient documentation

## 2022-05-21 DIAGNOSIS — J029 Acute pharyngitis, unspecified: Secondary | ICD-10-CM | POA: Diagnosis present

## 2022-05-21 LAB — GROUP A STREP BY PCR: Group A Strep by PCR: DETECTED — AB

## 2022-05-21 MED ORDER — DEXAMETHASONE SODIUM PHOSPHATE 10 MG/ML IJ SOLN
10.0000 mg | Freq: Once | INTRAMUSCULAR | Status: AC
  Administered 2022-05-21: 10 mg via INTRAMUSCULAR
  Filled 2022-05-21: qty 1

## 2022-05-21 MED ORDER — LIDOCAINE VISCOUS HCL 2 % MT SOLN: 15.0000 mL | OROMUCOSAL | 0 refills | Status: DC | PRN

## 2022-05-21 MED ORDER — PENICILLIN G BENZATHINE 1200000 UNIT/2ML IM SUSY
1.2000 10*6.[IU] | PREFILLED_SYRINGE | Freq: Once | INTRAMUSCULAR | Status: AC
  Administered 2022-05-21: 1.2 10*6.[IU] via INTRAMUSCULAR
  Filled 2022-05-21: qty 2

## 2022-05-21 MED ORDER — IBUPROFEN 800 MG PO TABS
800.0000 mg | ORAL_TABLET | Freq: Once | ORAL | Status: AC
  Administered 2022-05-21: 800 mg via ORAL
  Filled 2022-05-21: qty 1

## 2022-05-21 NOTE — Discharge Instructions (Addendum)
Take the prescribed medication as directed.  Continue tylenol or motrin as needed for fevers. Follow-up with your primary care doctor. Return to the ED for new or worsening symptoms.

## 2022-05-21 NOTE — ED Triage Notes (Signed)
Patient reports sore throat /dysphagia  onset last week , denies fever,cough  or chills .

## 2022-05-21 NOTE — ED Provider Notes (Signed)
[place] Provider Note   CSN: [id number] Arrival date & time: 05/21/22  0430     History  Chief Complaint  Patient presents with   Sore Throat    Terri Herrera is a 32 y.o. female.  The history is provided by the patient and medical records.  Sore Throat   32 y.o. F here with sore throat and painful swallowing since last week.  Denies cough, fever, chills, sweats.  No vomiting.  No sick contacts with similar.  States she feels a little achy.  No meds taken PTA.  Home Medications Prior to Admission medications   Medication Sig Start Date End Date Taking? Authorizing Provider  ferrous sulfate 325 (65 FE) MG tablet Take 1 tablet (325 mg total) by mouth every other day. Patient not taking: Reported on 03/16/2020 03/02/20 03/02/21  Randa Ngo, MD      Allergies    Acetaminophen    Review of Systems   Review of Systems  HENT:  Positive for sore throat.   All other systems reviewed and are negative.   Physical Exam Updated Vital Signs BP 105/81 (BP Location: Right Arm)   Pulse (!) 146   Temp 98.6 F (37 C) (Oral)   Resp 16   SpO2 97%   Physical Exam Vitals and nursing note reviewed.  Constitutional:      Appearance: She is well-developed.     Comments: Warm to touch, NAD  HENT:     Head: Normocephalic and atraumatic.     Mouth/Throat:     Comments: Tonsils 2+ bilaterally without exudates; uvula midline without evidence of peritonsillar abscess; handling secretions appropriately; no difficulty swallowing or speaking; normal phonation without stridor Eyes:     Conjunctiva/sclera: Conjunctivae normal.     Pupils: Pupils are equal, round, and reactive to light.  Cardiovascular:     Rate and Rhythm: Normal rate and regular rhythm.     Heart sounds: Normal heart sounds.  Pulmonary:     Effort: Pulmonary effort is normal.     Breath sounds: Normal breath sounds.  Abdominal:     General: Bowel sounds are normal.      Palpations: Abdomen is soft.  Musculoskeletal:        General: Normal range of motion.     Cervical back: Normal range of motion.  Skin:    General: Skin is warm and dry.  Neurological:     Mental Status: She is alert and oriented to person, place, and time.     ED Results / Procedures / Treatments   Labs (all labs ordered are listed, but only abnormal results are displayed) Labs Reviewed  GROUP A STREP BY PCR - Abnormal; Notable for the following components:      Result Value   Group A Strep by PCR DETECTED (*)    All other components within normal limits    EKG None  Radiology No results found.  Procedures Procedures    Medications Ordered in ED Medications - No data to display  ED Course/ Medical Decision Making/ A&P                             Medical Decision Making Risk Prescription drug management.   32 y.o. F here with sore throat and painful swallowing since last week.  Also feels a little achy.  Febrile and tachycardic on arrival but very non-toxic in appearance.  BP stable.  She does have tonsillar edema bilaterally without exudates.  Uvula midline, handling secretions well, no stridor.  Not clinically concerning for PTA or deep space infection of the neck.  Rapid strep is +.  Treated here with bicillin and decadron, tylenol or fever.  Plan to d/c home with symptomatic care.  Follow-up with PCP.  Return here for new concerns.  Final Clinical Impression(s) / ED Diagnoses Final diagnoses:  Strep throat    Rx / DC Orders ED Discharge Orders          Ordered    lidocaine (XYLOCAINE) 2 % solution  As needed        05/21/22 0529              Larene Pickett, PA-C 05/21/22 0543    Ripley Fraise, MD 05/21/22 [phone]

## 2024-01-07 ENCOUNTER — Ambulatory Visit (HOSPITAL_COMMUNITY)
Admission: EM | Admit: 2024-01-07 | Discharge: 2024-01-07 | Disposition: A | Discharge status: Home / Self Care | Attending: Internal Medicine | Admitting: Internal Medicine

## 2024-01-07 ENCOUNTER — Encounter (HOSPITAL_COMMUNITY): Payer: Self-pay

## 2024-01-07 DIAGNOSIS — S0502XA Injury of conjunctiva and corneal abrasion without foreign body, left eye, initial encounter: Secondary | ICD-10-CM

## 2024-01-07 MED ORDER — ERYTHROMYCIN 5 MG/GM OP OINT: TOPICAL_OINTMENT | OPHTHALMIC | 0 refills | Status: DC

## 2024-01-07 MED ORDER — FLUORESCEIN SODIUM 1 MG OP STRP
ORAL_STRIP | OPHTHALMIC | Status: AC
  Filled 2024-01-07: qty 1

## 2024-01-07 MED ORDER — DICLOFENAC SODIUM 0.1 % OP SOLN: 1.0000 [drp] | Freq: Two times a day (BID) | OPHTHALMIC | 0 refills | Status: DC

## 2024-01-07 MED ORDER — TETRACAINE HCL 0.5 % OP SOLN
OPHTHALMIC | Status: AC
  Filled 2024-01-07: qty 4

## 2024-01-07 MED ORDER — MOXIFLOXACIN HCL 0.5 % OP SOLN: 1.0000 [drp] | Freq: Three times a day (TID) | OPHTHALMIC | 0 refills | Status: DC

## 2024-01-07 NOTE — Discharge Instructions (Addendum)
 Fluorescein staining done today shows a left corneal abrasion.  This is on the larger side and will need follow-up with ophthalmology.  For the pain we will call in diclofenac ophthalmic drops.  May place 1 drop in the left eye twice daily for pain for 5 days.  Do not use past 5 days.  Will also call in an antibiotic ointment called erythromycin.  Place a small amount in the eye twice daily.  We have contacted the ophthalmology group that is on-call today, Dr. Fleeta.  You will need to contact the ophthalmology group listed on your paperwork first thing tomorrow to schedule a follow-up appointment.  Do not apply contact to the eye. Use sunglasses when in the sun.

## 2024-01-07 NOTE — ED Triage Notes (Signed)
 Patient here today with c/o left eye irritation after trying to get her contact out today. Patient states that the first time she used contacts was yesterday. Patient is unable to open her eyes.

## 2024-01-07 NOTE — ED Provider Notes (Signed)
 [facility]    CSN: [id number] Arrival date & time: 01/07/24  1645      History   Chief Complaint Chief Complaint  Patient presents with   Eye Problem    HPI Terri Herrera is a 33 y.o. female.   33 year old female presents urgent care with complaints of left eye pain, redness and drainage.  She reports that yesterday she started wearing colored contacts from a [employer].  She took them out last night without problems.  However today she took them out when she wanted to lay down for a small amount of time and had difficulty with the left.  She immediately started having pain once the contact was out on the left.  She did not want to try and take the right out because the pain was so severe on the left.  The pain has gotten to the point where she can barely open either one of her eyes.  She relates feeling like she has gravel in her eyes.  She is not able to open her eyes enough to really see around her.  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  She is not having pain in the right eye but did not try to take the contact out.  She has not had to see an ophthalmologist in years.  She has never worn contacts in the past.   Eye Problem Associated symptoms: discharge, photophobia and redness   Associated symptoms: no vomiting     Past Medical History:  Diagnosis Date   Alcohol use affecting pregnancy    Bronchitis    Gonorrhea    Vaginal Pap smear, abnormal     Patient Active Problem List   Diagnosis Date Noted   Preterm uterine contractions 03/21/2020   Non-stress test reactive 03/21/2020   IUD (intrauterine device) in place 03/21/2020   Tobacco use in pregnancy, antepartum 03/02/2020   Limited prenatal care 03/02/2020   Anemia in pregnancy, third trimester 03/02/2020   Supervision of other normal pregnancy, antepartum 10/02/2019   Alcohol abuse affecting pregnancy 03/28/2018   Tobacco abuse 03/28/2018   History of prior pregnancy with SGA newborn 11/15/2017    Preterm delivery 05/24/2014    Past Surgical History:  Procedure Laterality Date   broken knee May 2021 right     WISDOM TOOTH EXTRACTION      OB History     Gravida  6   Para  3   Term  2   Preterm  1   AB  3   Living  3      SAB  3   IAB      Ectopic      Multiple  0   Live Births  3        Obstetric Comments  Admitted ETOH daily and tobacco use during pregnancy          Home Medications    Prior to Admission medications   Medication Sig Start Date End Date Taking? Authorizing Provider  diclofenac (VOLTAREN) 0.1 % ophthalmic solution Place 1 drop into the left eye 2 (two) times daily. 01/07/24  Yes Kino Dunsworth A, PA-C  moxifloxacin (VIGAMOX) 0.5 % ophthalmic solution Place 1 drop into the left eye 3 (three) times daily. 01/07/24  Yes Allyah Heather A, PA-C  lidocaine  (XYLOCAINE ) 2 % solution Use as directed 15 mLs in the mouth or throat as needed for mouth pain. 05/21/22   Jarold Olam CHRISTELLA, PA-C    Family History Family History  Problem Relation Age of Onset   Cancer Paternal Grandmother    Asthma Mother    Diabetes Mother    Alcohol abuse Mother    Cancer Father    Cancer Paternal Uncle     Social History Social History   Tobacco Use   Smoking status: Every Day    Current packs/day: 0.25    Types: Cigarettes   Smokeless tobacco: Never   Tobacco comments:    NON- [place]   Vaping Use   Vaping status: Never Used  Substance Use Topics   Alcohol use: Yes    Comment: occasional   Drug use: Not Currently     Allergies   Acetaminophen    Review of Systems Review of Systems  Constitutional:  Negative for chills and fever.  HENT:  Negative for ear pain and sore throat.   Eyes:  Positive for photophobia, pain, discharge, redness and visual disturbance.  Respiratory:  Negative for cough and shortness of breath.   Cardiovascular:  Negative for chest pain and palpitations.  Gastrointestinal:  Negative for abdominal pain and  vomiting.  Genitourinary:  Negative for dysuria and hematuria.  Musculoskeletal:  Negative for arthralgias and back pain.  Skin:  Negative for color change and rash.  Neurological:  Negative for seizures and syncope.  All other systems reviewed and are negative.    Physical Exam Triage Vital Signs ED Triage Vitals  Encounter Vitals Group     BP 01/07/24 1724 122/83     Girls Systolic BP Percentile --      Girls Diastolic BP Percentile --      Boys Systolic BP Percentile --      Boys Diastolic BP Percentile --      Pulse Rate 01/07/24 1724 (!) 101     Resp 01/07/24 1724 16     Temp 01/07/24 1724 98.3 F (36.8 C)     Temp Source 01/07/24 1724 Oral     SpO2 01/07/24 1724 96 %     Weight --      Height --      Head Circumference --      Peak Flow --      Pain Score 01/07/24 1723 10     Pain Loc --      Pain Education --      Exclude from Growth Chart --    No data found.  Updated Vital Signs BP 122/83 (BP Location: Right Arm)   Pulse (!) 101   Temp 98.3 F (36.8 C) (Oral)   Resp 16   LMP  (LMP Unknown) Comment: Recently had IUD removed  SpO2 96%   Breastfeeding No   Visual Acuity Right Eye Distance:   Left Eye Distance:   Bilateral Distance:    Right Eye Near:   Left Eye Near:    Bilateral Near:     Physical Exam Vitals and nursing note reviewed.  Constitutional:      General: She is not in acute distress.    Appearance: She is well-developed.  HENT:     Head: Normocephalic and atraumatic.  Eyes:     General:        Left eye: No foreign body.     Conjunctiva/sclera:     Left eye: Left conjunctiva is injected.      Comments: Significant watering of the eye.  Cardiovascular:     Rate and Rhythm: Normal rate and regular rhythm.     Heart sounds: No murmur heard. Pulmonary:  Effort: Pulmonary effort is normal. No respiratory distress.     Breath sounds: Normal breath sounds.  Abdominal:     Palpations: Abdomen is soft.     Tenderness: There is no  abdominal tenderness.  Musculoskeletal:        General: No swelling.     Cervical back: Neck supple.  Skin:    General: Skin is warm and dry.     Capillary Refill: Capillary refill takes less than 2 seconds.  Neurological:     Mental Status: She is alert.  Psychiatric:        Mood and Affect: Mood normal.      UC Treatments / Results  Labs (all labs ordered are listed, but only abnormal results are displayed) Labs Reviewed - No data to display  EKG   Radiology No results found.  Procedures Procedures (including critical care time)  Medications Ordered in UC Medications - No data to display  Initial Impression / Assessment and Plan / UC Course  I have reviewed the triage vital signs and the nursing notes.  Pertinent labs & imaging results that were available during my care of the patient were reviewed by me and considered in my medical decision making (see chart for details).     Abrasion of left cornea, initial encounter   Fluorescein staining done today shows a left corneal abrasion.  This is on the larger side and will need follow-up with ophthalmology.  For the pain we will call in diclofenac ophthalmic drops.  May place 1 drop in the left eye twice daily for pain for 5 days.  Do not use past 5 days.  Will also call in an antibiotic drop called Moxifloxacin.  Place 1 drop in the eye three times daily.  You will need to contact the ophthalmology group listed on your paperwork first thing tomorrow to schedule a follow-up appointment.  Do not apply contact to the eye. Use sunglasses when in the sun.   Final Clinical Impressions(s) / UC Diagnoses   Final diagnoses:  Abrasion of left cornea, initial encounter     Discharge Instructions      Fluorescein staining done today shows a left corneal abrasion.  This is on the larger side and will need follow-up with ophthalmology.  For the pain we will call in diclofenac ophthalmic drops.  May place 1 drop in the left eye  twice daily for pain for 5 days.  Do not use past 5 days.  Will also call in an antibiotic ointment called erythromycin.  Place a small amount in the eye twice daily.  You will need to contact the ophthalmology group listed on your paperwork first thing tomorrow to schedule a follow-up appointment.  Do not apply contact to the eye. Use sunglasses when in the sun.      ED Prescriptions     Medication Sig Dispense Auth. Provider   erythromycin ophthalmic ointment  (Status: Discontinued) Place a 1/2 inch ribbon of ointment into the lower eyelid twice daily 3.5 g Giles Currie A, PA-C   diclofenac (VOLTAREN) 0.1 % ophthalmic solution Place 1 drop into the left eye 2 (two) times daily. 5 mL Harshini Trent A, PA-C   moxifloxacin (VIGAMOX) 0.5 % ophthalmic solution Place 1 drop into the left eye 3 (three) times daily. 3 mL Teresa Almarie LABOR, [place]      PDMP not reviewed this encounter.   Teresa Almarie LABOR, [place] 01/07/24 [id number]

## 2024-02-11 ENCOUNTER — Ambulatory Visit (HOSPITAL_COMMUNITY)
Admission: EM | Admit: 2024-02-11 | Discharge: 2024-02-11 | Disposition: A | Discharge status: Home / Self Care | Attending: Family Medicine | Admitting: Family Medicine

## 2024-02-11 ENCOUNTER — Encounter (HOSPITAL_COMMUNITY): Payer: Self-pay

## 2024-02-11 DIAGNOSIS — N760 Acute vaginitis: Secondary | ICD-10-CM | POA: Insufficient documentation

## 2024-02-11 DIAGNOSIS — R1033 Periumbilical pain: Secondary | ICD-10-CM | POA: Insufficient documentation

## 2024-02-11 DIAGNOSIS — Z202 Contact with and (suspected) exposure to infections with a predominantly sexual mode of transmission: Secondary | ICD-10-CM | POA: Insufficient documentation

## 2024-02-11 LAB — POCT URINE DIPSTICK
Blood, UA: NEGATIVE
Glucose, UA: NEGATIVE mg/dL
Leukocytes, UA: NEGATIVE
Nitrite, UA: NEGATIVE
POC PROTEIN,UA: 100 — AB
Spec Grav, UA: >=1.03 — AB (ref 1.010–1.025)
Urobilinogen, UA: 2 U/dL — AB
pH, UA: 5.5 (ref 5.0–8.0)

## 2024-02-11 LAB — POCT URINE PREGNANCY: Preg Test, Ur: NEGATIVE

## 2024-02-11 LAB — HIV ANTIBODY (ROUTINE TESTING W REFLEX): HIV Screen 4th Generation wRfx: NONREACTIVE

## 2024-02-11 MED ORDER — METRONIDAZOLE 500 MG PO TABS: 500.0000 mg | ORAL_TABLET | Freq: Two times a day (BID) | ORAL | 0 refills | Status: AC

## 2024-02-11 NOTE — ED Triage Notes (Signed)
 Pt c/o lower abdominal pain with vaginal d/c x2 days. States had unprotected intercourse and requesting STD testing.

## 2024-02-11 NOTE — ED Provider Notes (Signed)
 [facility]    CSN: [id number] Arrival date & time: 02/11/24  1445      History   Chief Complaint Chief Complaint  Patient presents with   SEXUALLY TRANSMITTED DISEASE    HPI Terri Herrera is a 33 y.o. female.   HPI Here for vaginal discharge and itching.  Is been going on for about 2 days.  She has had some mid abdominal pain for a few days more.  No fever and no chills.  She onetime had some dysuria but that has been an isolated incident.  She is requesting STD testing as she has had unprotected intercourse.  She is allergic to Tylenol  She has not had a period in some time as she has an IUD    Past Medical History:  Diagnosis Date   Alcohol use affecting pregnancy    Bronchitis    Gonorrhea    Vaginal Pap smear, abnormal     Patient Active Problem List   Diagnosis Date Noted   Preterm uterine contractions 03/21/2020   Non-stress test reactive 03/21/2020   IUD (intrauterine device) in place 03/21/2020   Tobacco use in pregnancy, antepartum 03/02/2020   Limited prenatal care 03/02/2020   Anemia in pregnancy, third trimester 03/02/2020   Supervision of other normal pregnancy, antepartum 10/02/2019   Alcohol abuse affecting pregnancy 03/28/2018   Tobacco abuse 03/28/2018   History of prior pregnancy with SGA newborn 11/15/2017   Preterm delivery 05/24/2014    Past Surgical History:  Procedure Laterality Date   broken knee May 2021 right     WISDOM TOOTH EXTRACTION      OB History     Gravida  6   Para  3   Term  2   Preterm  1   AB  3   Living  3      SAB  3   IAB      Ectopic      Multiple  0   Live Births  3        Obstetric Comments  Admitted ETOH daily and tobacco use during pregnancy          Home Medications    Prior to Admission medications   Medication Sig Start Date End Date Taking? Authorizing Provider  metroNIDAZOLE  (FLAGYL ) 500 MG tablet Take 1 tablet (500 mg total) by mouth 2 (two) times daily for  7 days. 02/11/24 02/18/24 Yes Vonna Sharlet POUR, MD  diclofenac  (VOLTAREN ) 0.1 % ophthalmic solution Place 1 drop into the left eye 2 (two) times daily. 01/07/24   White, Elizabeth A, PA-C  lidocaine  (XYLOCAINE ) 2 % solution Use as directed 15 mLs in the mouth or throat as needed for mouth pain. 05/21/22   Jarold Olam CHRISTELLA, PA-C  moxifloxacin  (VIGAMOX ) 0.5 % ophthalmic solution Place 1 drop into the left eye 3 (three) times daily. 01/07/24   Teresa Almarie LABOR, PA-C    Family History Family History  Problem Relation Age of Onset   Cancer Paternal Grandmother    Asthma Mother    Diabetes Mother    Alcohol abuse Mother    Cancer Father    Cancer Paternal Uncle     Social History Social History   Tobacco Use   Smoking status: Every Day    Current packs/day: 0.25    Types: Cigarettes   Smokeless tobacco: Never   Tobacco comments:    NON- MENTHOL   Vaping Use   Vaping status: Never Used  Substance Use Topics  Alcohol use: Yes    Comment: occasional   Drug use: Not Currently     Allergies   Acetaminophen    Review of Systems Review of Systems   Physical Exam Triage Vital Signs ED Triage Vitals  Encounter Vitals Group     BP 02/11/24 1621 (!) 98/58     Girls Systolic BP Percentile --      Girls Diastolic BP Percentile --      Boys Systolic BP Percentile --      Boys Diastolic BP Percentile --      Pulse Rate 02/11/24 1621 (!) 105     Resp 02/11/24 1621 16     Temp 02/11/24 1621 98.4 F (36.9 C)     Temp Source 02/11/24 1621 Oral     SpO2 02/11/24 1621 95 %     Weight --      Height --      Head Circumference --      Peak Flow --      Pain Score 02/11/24 1619 0     Pain Loc --      Pain Education --      Exclude from Growth Chart --    No data found.  Updated Vital Signs BP (!) 98/58 (BP Location: Left Arm)   Pulse (!) 105   Temp 98.4 F (36.9 C) (Oral)   Resp 16   SpO2 95%   Visual Acuity Right Eye Distance:   Left Eye Distance:   Bilateral  Distance:    Right Eye Near:   Left Eye Near:    Bilateral Near:     Physical Exam Vitals reviewed.  Constitutional:      General: She is not in acute distress.    Appearance: She is not ill-appearing, toxic-appearing or diaphoretic.  HENT:     Mouth/Throat:     Mouth: Mucous membranes are moist.     Pharynx: No oropharyngeal exudate or posterior oropharyngeal erythema.  Eyes:     Extraocular Movements: Extraocular movements intact.     Conjunctiva/sclera: Conjunctivae normal.     Pupils: Pupils are equal, round, and reactive to light.  Cardiovascular:     Rate and Rhythm: Normal rate and regular rhythm.     Heart sounds: No murmur heard. Pulmonary:     Effort: Pulmonary effort is normal. No respiratory distress.     Breath sounds: No stridor. No wheezing, rhonchi or rales.  Abdominal:     General: There is no distension.     Palpations: Abdomen is soft.     Comments: There is some tenderness of the left periumbilical area.  No suprapubic tenderness or lower quadrant tenderness  Musculoskeletal:     Cervical back: Neck supple.  Lymphadenopathy:     Cervical: No cervical adenopathy.  Skin:    Capillary Refill: Capillary refill takes less than 2 seconds.     Coloration: Skin is not jaundiced or pale.  Neurological:     General: No focal deficit present.     Mental Status: She is alert and oriented to person, place, and time.  Psychiatric:        Behavior: Behavior normal.      UC Treatments / Results  Labs (all labs ordered are listed, but only abnormal results are displayed) Labs Reviewed  POCT URINE DIPSTICK - Abnormal; Notable for the following components:      Result Value   Clarity, UA turbid (*)    Bilirubin, UA small (*)    Ketones,  POC UA trace (5) (*)    Spec Grav, UA >=1.030 (*)    POC PROTEIN,UA =100 (*)    Urobilinogen, UA 2.0 (*)    All other components within normal limits  HIV ANTIBODY (ROUTINE TESTING W REFLEX)  SYPHILIS: RPR W/REFLEX TO RPR  TITER AND TREPONEMAL ANTIBODIES, TRADITIONAL SCREENING AND DIAGNOSIS ALGORITHM  POCT URINE PREGNANCY  CERVICOVAGINAL ANCILLARY ONLY    EKG   Radiology No results found.  Procedures Procedures (including critical care time)  Medications Ordered in UC Medications - No data to display  Initial Impression / Assessment and Plan / UC Course  I have reviewed the triage vital signs and the nursing notes.  Pertinent labs & imaging results that were available during my care of the patient were reviewed by me and considered in my medical decision making (see chart for details).     UPT is negative  Urinalysis is negative for red blood cells, nitrites or leukocytes.  Blood is drawn for HIV and RPR, and staff will notify them if any of that is positive  Vaginal self swab is done, and we will notify of any positives on that and treat per protocol.  We discussed considering treatment, and decided to do metronidazole  to treat empirically for possible BV.    Final Clinical Impressions(s) / UC Diagnoses   Final diagnoses:  Periumbilical abdominal pain  Acute vaginitis  Potential exposure to STD     Discharge Instructions      The pregnancy test is negative  The urinalysis did not show signs of urinary infection  Staff will notify you if there is anything positive on the swab or blood work. It can take 2-3 days for the tests to result, depending on the day of the week your test was taken. You will only be notified if there are any positives on the testing; test results will also go to your MyChart if you are signed up for MyChart.   Take metronidazole  500 mg--1 tablet 2 times daily for 7 days.  Avoid drinking alcohol within 72 hours of taking this medication        ED Prescriptions     Medication Sig Dispense Auth. Provider   metroNIDAZOLE  (FLAGYL ) 500 MG tablet Take 1 tablet (500 mg total) by mouth 2 (two) times daily for 7 days. 14 tablet Lakyla Biswas K, MD       PDMP not reviewed this encounter.   Vonna Sharlet POUR, MD 02/11/24 [phone]

## 2024-02-11 NOTE — Discharge Instructions (Addendum)
 The pregnancy test is negative  The urinalysis did not show signs of urinary infection  Staff will notify you if there is anything positive on the swab or blood work. It can take 2-3 days for the tests to result, depending on the day of the week your test was taken. You will only be notified if there are any positives on the testing; test results will also go to your MyChart if you are signed up for MyChart.   Take metronidazole  500 mg--1 tablet 2 times daily for 7 days.  Avoid drinking alcohol within 72 hours of taking this medication

## 2024-02-12 LAB — SYPHILIS: RPR W/REFLEX TO RPR TITER AND TREPONEMAL ANTIBODIES, TRADITIONAL SCREENING AND DIAGNOSIS ALGORITHM: RPR Ser Ql: NONREACTIVE

## 2024-02-14 ENCOUNTER — Ambulatory Visit (HOSPITAL_COMMUNITY): Payer: Self-pay

## 2024-02-14 LAB — CERVICOVAGINAL ANCILLARY ONLY
Bacterial Vaginitis (gardnerella): POSITIVE — AB
Candida Glabrata: NEGATIVE
Candida Vaginitis: NEGATIVE
Chlamydia: NEGATIVE
Comment: NEGATIVE
Comment: NEGATIVE
Comment: NEGATIVE
Comment: NEGATIVE
Comment: NEGATIVE
Comment: NORMAL
Neisseria Gonorrhea: NEGATIVE
Trichomonas: NEGATIVE

## 2024-02-21 ENCOUNTER — Ambulatory Visit (HOSPITAL_COMMUNITY)

## 2024-02-21 ENCOUNTER — Encounter (HOSPITAL_COMMUNITY): Payer: Self-pay

## 2024-02-21 ENCOUNTER — Ambulatory Visit (HOSPITAL_COMMUNITY): Admission: EM | Admit: 2024-02-21 | Discharge: 2024-02-21 | Disposition: A | Discharge status: Home / Self Care

## 2024-02-21 DIAGNOSIS — S20212A Contusion of left front wall of thorax, initial encounter: Secondary | ICD-10-CM

## 2024-02-21 DIAGNOSIS — S46912A Strain of unspecified muscle, fascia and tendon at shoulder and upper arm level, left arm, initial encounter: Secondary | ICD-10-CM | POA: Diagnosis not present

## 2024-02-21 MED ORDER — DICLOFENAC SODIUM 50 MG PO TBEC: 50.0000 mg | DELAYED_RELEASE_TABLET | Freq: Two times a day (BID) | ORAL | 1 refills | Status: AC

## 2024-02-21 MED ORDER — BACLOFEN 10 MG PO TABS: 10.0000 mg | ORAL_TABLET | Freq: Three times a day (TID) | ORAL | 0 refills | Status: AC

## 2024-02-21 NOTE — ED Provider Notes (Signed)
 [facility]  Note:  This document was prepared using Conservation officer, historic buildings and may include unintentional dictation errors.  MRN: [id number] DOB: [birth year]  Subjective:   Terri Herrera is a 33 y.o. female presenting for evaluation of left shoulder pain with radiation to the left anterior chest.  Patient reports increased pain with left arm movement and palpation of the anterior chest.  Patient denies any known injury or trauma to the chest or left arm/shoulder.  Patient denies any past history of shoulder pain or severe injury.  No shortness of breath, weakness, dizziness, fever.  Current Medications[1]   Allergies[2]  Past Medical History:  Diagnosis Date   Alcohol use affecting pregnancy    Bronchitis    Gonorrhea    Vaginal Pap smear, abnormal      Past Surgical History:  Procedure Laterality Date   broken knee May 2021 right     WISDOM TOOTH EXTRACTION      Family History  Problem Relation Age of Onset   Cancer Paternal Grandmother    Asthma Mother    Diabetes Mother    Alcohol abuse Mother    Cancer Father    Cancer Paternal Uncle     Social History[3]  ROS Refer to HPI for ROS details.  Objective:    Vitals: BP 121/71 (BP Location: Left Arm)   Pulse 98   Temp 98.6 F (37 C) (Oral)   Resp 18   SpO2 94%   Physical Exam Vitals and nursing note reviewed.  Constitutional:      General: She is not in acute distress.    Appearance: Normal appearance. She is well-developed. She is not ill-appearing or toxic-appearing.  HENT:     Head: Normocephalic and atraumatic.  Cardiovascular:     Rate and Rhythm: Normal rate.  Pulmonary:     Effort: Pulmonary effort is normal. No respiratory distress.     Breath sounds: No stridor. No wheezing.  Chest:     Chest wall: Tenderness present.  Musculoskeletal:     Left shoulder: Tenderness and bony tenderness present. No swelling, deformity or crepitus. Decreased range of motion.  Decreased strength. Normal pulse.  Skin:    General: Skin is warm and dry.  Neurological:     General: No focal deficit present.     Mental Status: She is alert and oriented to person, place, and time.  Psychiatric:        Mood and Affect: Mood normal.        Behavior: Behavior normal.     Procedures  No results found for this or any previous visit (from the past 24 hours).  Assessment and Plan :     Discharge Instructions       1. Left shoulder strain, initial encounter (Primary) - DG Shoulder Left x-ray performed in UC shows no acute fracture or dislocation of the left shoulder, symptoms most likely secondary to shoulder strain/sprain. - baclofen (LIORESAL) 10 MG tablet; Take 1 tablet (10 mg total) by mouth 3 (three) times daily.  Dispense: 30 each; Refill: 0 - AMB referral to orthopedics for ongoing evaluation and management of left shoulder pain and decreased range of motion - Apply Sling & Swathe to left shoulder for protection and immobilization until follow-up with orthopedics  2. Chest wall contusion, left, initial encounter - DG Chest 2 View x-ray performed in UC shows no acute cardiopulmonary processes, no sign of consolidation or pneumonia, no sign of any chest wall abnormality. - diclofenac  (VOLTAREN )  50 MG EC tablet; Take 1 tablet (50 mg total) by mouth 2 (two) times daily.  Dispense: 30 tablet; Refill: 1  -Continue to monitor symptoms for any change in severity if there is any escalation of current symptoms or development of new symptoms follow-up in ER for further evaluation and management.      Katia Hannen B Valleri Hendricksen    [1] No current facility-administered medications for this encounter.  Current Outpatient Medications:    baclofen (LIORESAL) 10 MG tablet, Take 1 tablet (10 mg total) by mouth 3 (three) times daily., Disp: 30 each, Rfl: 0   diclofenac  (VOLTAREN ) 50 MG EC tablet, Take 1 tablet (50 mg total) by mouth 2 (two) times daily., Disp: 30 tablet, Rfl:  1 [2]  Allergies Allergen Reactions   Acetaminophen  Anaphylaxis, Itching and Rash  [3]  Social History Tobacco Use   Smoking status: Every Day    Current packs/day: 0.25    Types: Cigarettes   Smokeless tobacco: Never   Tobacco comments:    NON- MENTHOL   Vaping Use   Vaping status: Never Used  Substance Use Topics   Alcohol use: Yes    Comment: occasional   Drug use: Not Currently     Aurea Ethel NOVAK, NP 02/21/24 1122

## 2024-02-21 NOTE — Discharge Instructions (Signed)
°  1. Left shoulder strain, initial encounter (Primary) - DG Shoulder Left x-ray performed in UC shows no acute fracture or dislocation of the left shoulder, symptoms most likely secondary to shoulder strain/sprain. - baclofen (LIORESAL) 10 MG tablet; Take 1 tablet (10 mg total) by mouth 3 (three) times daily.  Dispense: 30 each; Refill: 0 - AMB referral to orthopedics for ongoing evaluation and management of left shoulder pain and decreased range of motion - Apply Sling & Swathe to left shoulder for protection and immobilization until follow-up with orthopedics  2. Chest wall contusion, left, initial encounter - DG Chest 2 View x-ray performed in UC shows no acute cardiopulmonary processes, no sign of consolidation or pneumonia, no sign of any chest wall abnormality. - diclofenac  (VOLTAREN ) 50 MG EC tablet; Take 1 tablet (50 mg total) by mouth 2 (two) times daily.  Dispense: 30 tablet; Refill: 1  -Continue to monitor symptoms for any change in severity if there is any escalation of current symptoms or development of new symptoms follow-up in ER for further evaluation and management.

## 2024-02-21 NOTE — ED Triage Notes (Signed)
 Patient states pain in her left shoulder that radiating down to her left arm.Patient states it hurts to take a deep breath. No recent injuries.
